# Patient Record
Sex: Female | Born: 1980 | Race: Black or African American | Hispanic: No | Marital: Married | Smoking: Never smoker
Health system: Southern US, Community
[De-identification: ages and names within clinical notes are randomized; demographics above are authoritative.]

## PROBLEM LIST (undated history)

## (undated) DIAGNOSIS — O139 Gestational [pregnancy-induced] hypertension without significant proteinuria, unspecified trimester: Secondary | ICD-10-CM

## (undated) DIAGNOSIS — O24419 Gestational diabetes mellitus in pregnancy, unspecified control: Secondary | ICD-10-CM

## (undated) DIAGNOSIS — D573 Sickle-cell trait: Secondary | ICD-10-CM

## (undated) DIAGNOSIS — D649 Anemia, unspecified: Secondary | ICD-10-CM

## (undated) DIAGNOSIS — M419 Scoliosis, unspecified: Secondary | ICD-10-CM

---
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
## 2007-03-22 ENCOUNTER — Emergency Department (HOSPITAL_COMMUNITY): Admission: EM | Admit: 2007-03-22 | Discharge: 2007-03-22 | Payer: Self-pay | Admitting: Emergency Medicine

## 2008-05-26 ENCOUNTER — Emergency Department (HOSPITAL_COMMUNITY): Admission: EM | Admit: 2008-05-26 | Discharge: 2008-05-26 | Payer: Self-pay | Admitting: Emergency Medicine

## 2010-02-04 ENCOUNTER — Inpatient Hospital Stay (HOSPITAL_COMMUNITY): Admission: AD | Admit: 2010-02-04 | Discharge: 2010-02-08 | Payer: Self-pay | Admitting: Obstetrics and Gynecology

## 2010-12-15 LAB — RPR: RPR Ser Ql: NONREACTIVE

## 2010-12-15 LAB — CBC
HCT: 34.3 % — ABNORMAL LOW (ref 36.0–46.0)
Hemoglobin: 11.5 g/dL — ABNORMAL LOW (ref 12.0–15.0)
MCHC: 34.8 g/dL (ref 30.0–36.0)
MCHC: 35 g/dL (ref 30.0–36.0)
MCV: 101.5 fL — ABNORMAL HIGH (ref 78.0–100.0)
Platelets: 161 10*3/uL (ref 150–400)
Platelets: 198 10*3/uL (ref 150–400)
RDW: 13.7 % (ref 11.5–15.5)
RDW: 14.1 % (ref 11.5–15.5)

## 2012-07-06 ENCOUNTER — Emergency Department (HOSPITAL_COMMUNITY)
Admission: EM | Admit: 2012-07-06 | Discharge: 2012-07-06 | Disposition: A | Discharge status: Home / Self Care | Payer: BC Managed Care – PPO | Source: Home / Self Care

## 2012-07-06 ENCOUNTER — Encounter (HOSPITAL_COMMUNITY): Payer: Self-pay | Admitting: *Deleted

## 2012-07-06 DIAGNOSIS — M25561 Pain in right knee: Secondary | ICD-10-CM

## 2012-07-06 DIAGNOSIS — M25569 Pain in unspecified knee: Secondary | ICD-10-CM

## 2012-07-06 MED ORDER — MELOXICAM 7.5 MG PO TABS: 7.5000 mg | ORAL_TABLET | Freq: Every day | ORAL | Status: DC

## 2012-07-06 MED ORDER — TRAMADOL HCL 50 MG PO TABS: 50.0000 mg | ORAL_TABLET | Freq: Four times a day (QID) | ORAL | Status: DC | PRN

## 2012-07-06 NOTE — ED Provider Notes (Signed)
History     CSN: [id number]  Arrival date & time 07/06/12  1654   None     Chief Complaint  Patient presents with  . Knee Pain    (Consider location/radiation/quality/duration/timing/severity/associated sxs/prior treatment) The history is provided by the patient.  complains of bilateral knee pain, right greater than left.  States pain has been recurrent over the past month.  Pain in right knee intermittent achy in nature, stiffness noted in the morning, worse with movement.  Denies known injury or past known injury.  No prior problems with this area in the past.  Has not taken medication at home for pain.  History reviewed. No pertinent past medical history.  History reviewed. No pertinent past surgical history.  Family History  Problem Relation Age of Onset  . Family history unknown: Yes    History  Substance Use Topics  . Smoking status: Never Smoker   . Smokeless tobacco: Not on file  . Alcohol Use: No    OB History    Grav Para Term Preterm Abortions TAB SAB Ect Mult Living   1 1        1       Review of Systems  Constitutional: Negative.   Respiratory: Negative.   Cardiovascular: Negative.   Musculoskeletal: Positive for arthralgias. Negative for myalgias, back pain, joint swelling and gait problem.  Skin: Negative.   Neurological: Negative.     Allergies  Review of patient's allergies indicates no known allergies.  Home Medications   Current Outpatient Rx  Name Route Sig Dispense Refill  . MELOXICAM 7.5 MG PO TABS Oral Take 1 tablet (7.5 mg total) by mouth daily. 60 tablet 1  . TRAMADOL HCL 50 MG PO TABS Oral Take 1 tablet (50 mg total) by mouth every 6 (six) hours as needed for pain. 20 tablet 0    BP 103/73  Pulse 91  Temp 98.4 F (36.9 C) (Oral)  Resp 16  SpO2 99%  LMP 05/31/2012  Physical Exam  Nursing note and vitals reviewed. Constitutional: She is oriented to person, place, and time. Vital signs are normal. She appears well-developed  and well-nourished. She is active and cooperative.  HENT:  Head: Normocephalic.  Eyes: Conjunctivae normal are normal. Pupils are equal, round, and reactive to light. No scleral icterus.  Neck: Trachea normal. Neck supple.  Cardiovascular: Normal rate and regular rhythm.   Pulmonary/Chest: Effort normal and breath sounds normal.  Musculoskeletal: Normal range of motion.       Right knee: Normal.       Left knee: Normal.  Neurological: She is alert and oriented to person, place, and time. No cranial nerve deficit or sensory deficit.  Skin: Skin is warm and dry.  Psychiatric: She has a normal mood and affect. Her speech is normal and behavior is normal. Judgment and thought content normal. Cognition and memory are normal.    ED Course  Procedures (including critical care time)  Labs Reviewed - No data to display No results found.   1. Right knee pain       MDM  Knee brace, nsaids       Johnsie Kindred, NP 07/13/12 [phone]

## 2012-07-06 NOTE — ED Notes (Signed)
Pt reports having right knee pain for the past 2 months - recently started new job requiring her to get up and down frequently - no other known injury. States that it feels like pins and needles.

## 2012-07-14 NOTE — ED Provider Notes (Signed)
Medical screening examination/treatment/procedure(s) were performed by resident physician or non-physician practitioner and as supervising physician I was immediately available for consultation/collaboration.   Bernard Slayden DOUGLAS MD.    Eryc Bodey D Layann Bluett, MD 07/14/12 1406 

## 2014-07-16 LAB — OB RESULTS CONSOLE ABO/RH: RH Type: POSITIVE

## 2014-07-16 LAB — OB RESULTS CONSOLE HEPATITIS B SURFACE ANTIGEN: HEP B S AG: NEGATIVE

## 2014-07-16 LAB — OB RESULTS CONSOLE ANTIBODY SCREEN: Antibody Screen: NEGATIVE

## 2014-07-16 LAB — OB RESULTS CONSOLE RUBELLA ANTIBODY, IGM: RUBELLA: IMMUNE

## 2014-07-16 LAB — OB RESULTS CONSOLE HIV ANTIBODY (ROUTINE TESTING): HIV: NONREACTIVE

## 2014-07-17 LAB — OB RESULTS CONSOLE GC/CHLAMYDIA
CHLAMYDIA, DNA PROBE: NEGATIVE
Gonorrhea: NEGATIVE

## 2014-07-29 ENCOUNTER — Encounter (HOSPITAL_COMMUNITY): Payer: Self-pay | Admitting: *Deleted

## 2014-11-19 LAB — OB RESULTS CONSOLE RPR: RPR: NONREACTIVE

## 2015-01-15 LAB — OB RESULTS CONSOLE GBS: STREP GROUP B AG: NEGATIVE

## 2015-02-06 ENCOUNTER — Inpatient Hospital Stay (HOSPITAL_COMMUNITY): Payer: BC Managed Care – PPO | Admitting: Anesthesiology

## 2015-02-06 ENCOUNTER — Encounter (HOSPITAL_COMMUNITY): Payer: Self-pay | Admitting: Obstetrics

## 2015-02-06 ENCOUNTER — Inpatient Hospital Stay (HOSPITAL_COMMUNITY)
Admission: AD | Admit: 2015-02-06 | Discharge: 2015-02-10 | DRG: 765 | Disposition: A | Discharge status: Home / Self Care | Payer: BC Managed Care – PPO | Source: Ambulatory Visit | Attending: Obstetrics and Gynecology | Admitting: Obstetrics and Gynecology

## 2015-02-06 DIAGNOSIS — O9989 Other specified diseases and conditions complicating pregnancy, childbirth and the puerperium: Secondary | ICD-10-CM | POA: Diagnosis present

## 2015-02-06 DIAGNOSIS — D573 Sickle-cell trait: Secondary | ICD-10-CM | POA: Diagnosis present

## 2015-02-06 DIAGNOSIS — Z3A39 39 weeks gestation of pregnancy: Secondary | ICD-10-CM | POA: Diagnosis present

## 2015-02-06 DIAGNOSIS — R030 Elevated blood-pressure reading, without diagnosis of hypertension: Secondary | ICD-10-CM | POA: Diagnosis not present

## 2015-02-06 DIAGNOSIS — O403XX0 Polyhydramnios, third trimester, not applicable or unspecified: Secondary | ICD-10-CM | POA: Diagnosis present

## 2015-02-06 DIAGNOSIS — O9903 Anemia complicating the puerperium: Secondary | ICD-10-CM | POA: Diagnosis present

## 2015-02-06 DIAGNOSIS — N838 Other noninflammatory disorders of ovary, fallopian tube and broad ligament: Secondary | ICD-10-CM | POA: Diagnosis present

## 2015-02-06 DIAGNOSIS — D62 Acute posthemorrhagic anemia: Secondary | ICD-10-CM | POA: Diagnosis not present

## 2015-02-06 DIAGNOSIS — O3663X0 Maternal care for excessive fetal growth, third trimester, not applicable or unspecified: Secondary | ICD-10-CM | POA: Diagnosis present

## 2015-02-06 HISTORY — DX: Scoliosis, unspecified: M41.9

## 2015-02-06 HISTORY — DX: Sickle-cell trait: D57.3

## 2015-02-06 LAB — URIC ACID: Uric Acid, Serum: 4.9 mg/dL (ref 2.3–6.6)

## 2015-02-06 LAB — CBC
HCT: 33.9 % — ABNORMAL LOW (ref 36.0–46.0)
Hemoglobin: 11.9 g/dL — ABNORMAL LOW (ref 12.0–15.0)
MCH: 33.6 pg (ref 26.0–34.0)
MCHC: 35.1 g/dL (ref 30.0–36.0)
MCV: 95.8 fL (ref 78.0–100.0)
Platelets: 224 10*3/uL (ref 150–400)
RBC: 3.54 MIL/uL — ABNORMAL LOW (ref 3.87–5.11)
RDW: 14.2 % (ref 11.5–15.5)
WBC: 6.2 10*3/uL (ref 4.0–10.5)

## 2015-02-06 LAB — COMPREHENSIVE METABOLIC PANEL
ALT: 10 U/L — ABNORMAL LOW (ref 14–54)
AST: 28 U/L (ref 15–41)
Albumin: 3.2 g/dL — ABNORMAL LOW (ref 3.5–5.0)
Alkaline Phosphatase: 98 U/L (ref 38–126)
Anion gap: 6 (ref 5–15)
BUN: 5 mg/dL — ABNORMAL LOW (ref 6–20)
CO2: 23 mmol/L (ref 22–32)
Calcium: 8.8 mg/dL — ABNORMAL LOW (ref 8.9–10.3)
Chloride: 108 mmol/L (ref 101–111)
Creatinine, Ser: 0.45 mg/dL (ref 0.44–1.00)
GFR calc Af Amer: >60 mL/min (ref >60)
GFR calc non Af Amer: >60 mL/min (ref >60)
Glucose, Bld: 109 mg/dL — ABNORMAL HIGH (ref 65–99)
Potassium: 3.9 mmol/L (ref 3.5–5.1)
Sodium: 137 mmol/L (ref 135–145)
Total Bilirubin: 0.9 mg/dL (ref 0.3–1.2)
Total Protein: 6.2 g/dL — ABNORMAL LOW (ref 6.5–8.1)

## 2015-02-06 LAB — TYPE AND SCREEN
ABO/RH(D): O POS
ANTIBODY SCREEN: NEGATIVE

## 2015-02-06 LAB — ABO/RH: ABO/RH(D): O POS

## 2015-02-06 MED ORDER — EPHEDRINE 5 MG/ML INJ: 10.0000 mg | INTRAVENOUS | Status: DC | PRN

## 2015-02-06 MED ORDER — CITRIC ACID-SODIUM CITRATE 334-500 MG/5ML PO SOLN
30.0000 mL | ORAL | Status: DC | PRN
  Administered 2015-02-07: 30 mL via ORAL
  Filled 2015-02-06: qty 15

## 2015-02-06 MED ORDER — ACETAMINOPHEN 325 MG PO TABS: 650.0000 mg | ORAL_TABLET | ORAL | Status: DC | PRN

## 2015-02-06 MED ORDER — FENTANYL 2.5 MCG/ML BUPIVACAINE 1/10 % EPIDURAL INFUSION (WH - ANES): 14.0000 mL/h | INTRAMUSCULAR | Status: DC | PRN

## 2015-02-06 MED ORDER — OXYTOCIN 40 UNITS IN LACTATED RINGERS INFUSION - SIMPLE MED
1.0000 m[IU]/min | INTRAVENOUS | Status: DC
  Administered 2015-02-06: 2 m[IU]/min via INTRAVENOUS
  Administered 2015-02-07: 4 m[IU]/min via INTRAVENOUS
  Administered 2015-02-07: 2 m[IU]/min via INTRAVENOUS
  Filled 2015-02-06: qty 1000

## 2015-02-06 MED ORDER — FENTANYL 2.5 MCG/ML BUPIVACAINE 1/10 % EPIDURAL INFUSION (WH - ANES)
14.0000 mL/h | INTRAMUSCULAR | Status: DC | PRN
  Administered 2015-02-06 – 2015-02-07 (×2): 14 mL/h via EPIDURAL
  Filled 2015-02-06 (×3): qty 125

## 2015-02-06 MED ORDER — OXYCODONE-ACETAMINOPHEN 5-325 MG PO TABS: 1.0000 | ORAL_TABLET | ORAL | Status: DC | PRN

## 2015-02-06 MED ORDER — LACTATED RINGERS IV SOLN
500.0000 mL | INTRAVENOUS | Status: DC | PRN
  Administered 2015-02-06: 1000 mL via INTRAVENOUS
  Administered 2015-02-06: 300 mL via INTRAVENOUS

## 2015-02-06 MED ORDER — PHENYLEPHRINE 40 MCG/ML (10ML) SYRINGE FOR IV PUSH (FOR BLOOD PRESSURE SUPPORT)
80.0000 ug | PREFILLED_SYRINGE | INTRAVENOUS | Status: DC | PRN
  Filled 2015-02-06 (×2): qty 20

## 2015-02-06 MED ORDER — LIDOCAINE HCL (PF) 1 % IJ SOLN: 30.0000 mL | INTRAMUSCULAR | Status: DC | PRN

## 2015-02-06 MED ORDER — OXYCODONE-ACETAMINOPHEN 5-325 MG PO TABS
2.0000 | ORAL_TABLET | ORAL | Status: DC | PRN
Start: 2015-02-06 — End: 2015-02-07

## 2015-02-06 MED ORDER — BUTORPHANOL TARTRATE 1 MG/ML IJ SOLN
1.0000 mg | INTRAMUSCULAR | Status: DC | PRN
  Administered 2015-02-06: 1 mg via INTRAVENOUS
  Filled 2015-02-06: qty 1

## 2015-02-06 MED ORDER — TERBUTALINE SULFATE 1 MG/ML IJ SOLN
INTRAMUSCULAR | Status: AC
  Administered 2015-02-06: 0.25 mg via SUBCUTANEOUS
  Filled 2015-02-06: qty 1

## 2015-02-06 MED ORDER — OXYTOCIN BOLUS FROM INFUSION: 500.0000 mL | INTRAVENOUS | Status: DC

## 2015-02-06 MED ORDER — OXYTOCIN 40 UNITS IN LACTATED RINGERS INFUSION - SIMPLE MED: 62.5000 mL/h | INTRAVENOUS | Status: DC

## 2015-02-06 MED ORDER — LACTATED RINGERS IV SOLN
INTRAVENOUS | Status: DC
  Administered 2015-02-06 – 2015-02-07 (×3): via INTRAVENOUS

## 2015-02-06 MED ORDER — DIPHENHYDRAMINE HCL 50 MG/ML IJ SOLN: 12.5000 mg | INTRAMUSCULAR | Status: DC | PRN

## 2015-02-06 MED ORDER — LIDOCAINE HCL (PF) 1 % IJ SOLN
INTRAMUSCULAR | Status: DC | PRN
  Administered 2015-02-06: 4 mL
  Administered 2015-02-06: 3 mL
  Administered 2015-02-06: 4 mL

## 2015-02-06 NOTE — H&P (Signed)
  OB ADMISSION/ HISTORY & PHYSICAL:  Admission Date: 02/06/2015 12:40 PM  Admit Diagnosis: 39.4 weeks polyhydramnios  Ellen Rowe is a 34 y.o. female presenting for induction of labor  Prenatal History: G3P1   EDC : 02/09/2015, by Other Basis  [facility]  Primary Ob Provider: Graylen Noboa [facility]  Prenatal Labs: ABO, Rh: O/Positive/-- (10/20 0000) Antibody: Negative (10/20 0000) Rubella: Immune (10/20 0000)  RPR: Nonreactive (02/23 0000)  HBsAg: Negative (10/20 0000)  HIV: Non-reactive (10/20 0000)  GTT: 132 GBS: Negative (04/20 0000)   Medical / Surgical History :  Past medical history:  Past Medical History  Diagnosis Date  . Sickle cell trait   . Scoliosis     Past surgical history:  Past Surgical History  Procedure Laterality Date  . Wisdom tooth extraction  2000   Family History:  Family History  Problem Relation Age of Onset  . Family history unknown: Yes    Social History:  reports that she has never smoked. She has never used smokeless tobacco. She reports that she does not drink alcohol or use illicit drugs.  Allergies: Review of patient's allergies indicates no known allergies.   Current Medications at time of admission:  Prior to Admission medications   Medication Sig Start Date End Date Taking? Authorizing Provider  Prenatal Vit-Fe Fumarate-FA (MULTIVITAMIN-PRENATAL) 27-0.8 MG TABS tablet Take 1 tablet by mouth daily at 12 noon.   Yes Historical Provider, MD   Review of Systems: Active FM No ctx felt No LOF bloody show absent  Physical Exam:  VS: Blood pressure 145/85, pulse 120, temperature 98.8 F (37.1 C), temperature source Oral, resp. rate 20, height 5\' 1"  (1.549 m), weight 102.967 kg (227 lb).  General: alert and oriented, appears comfortable Heart: RRR Lungs: Clear lung fields Abdomen: Gravid, soft and non-tender, non-distended / uterus: gravid with  size>dates Extremities: 3+ dependent edema  Genitalia / VE:  posterior / soft / FT-1cm / 50% / vtx -3 ballotable  FHR: baseline rate 150 / variability decreased  / accelerations absent / no decelerations TOCO: no ctx or UI  Assessment: 39.[redacted] weeks gestation with size>dates polyhydramnios Induction of labor FHR category 2   Plan:  Admit Cervical balloon with low dose pitocin   Dr Cherly Hensenousins notified of admission / plan of care - MD to follow patient   Marlinda MikeBAILEY, TANYA CNM, MSN, [facility]AC[facility] 02/06/2015, 2:21 PM

## 2015-02-06 NOTE — Progress Notes (Signed)
Ellen Rowe is a 34 y.o. G3P1 at 9669w4d by ultrasound admitted for induction of labor due to Hydramnios.  Subjective: S/p stadol. (+) painful ctx  Objective: BP 139/90 mmHg  Pulse 104  Temp(Src) 98.5 F (36.9 C) (Oral)  Resp 20  Ht 5\' 1"  (1.549 m)  Wt 102.967 kg (227 lb)  BMI 42.91 kg/m2    Pitocin  FHT:  FHR: 150 bpm, variability: minimal ,  accelerations:  Abscent,  decelerations:  Absent UC:   irregular, every 2-4 minutes SVE:   Balloon removed. Cervix ? 3 cm/patulous/ pp out of pelvis   Labs: Lab Results  Component Value Date   WBC 6.2 02/06/2015   HGB 11.9* 02/06/2015   HCT 33.9* 02/06/2015   MCV 95.8 02/06/2015   PLT 224 02/06/2015    Assessment / Plan: Polyhydramnios Term gestation P) Epidural. Cont pitocin. Controlled amniotomy prn  Anticipated MOD:  NSVD  Ellen Rowe A 02/06/2015, 8:39 PM

## 2015-02-06 NOTE — Anesthesia Procedure Notes (Signed)
Epidural Patient location during procedure: OB Start time: 02/06/2015 9:30 PM  Preanesthetic Checklist Completed: patient identified, site marked, surgical consent, pre-op evaluation, timeout performed, IV checked, risks and benefits discussed and monitors and equipment checked  Epidural Patient position: sitting Prep: site prepped and draped and DuraPrep Patient monitoring: continuous pulse ox and blood pressure Approach: midline Location: L3-L4 Injection technique: LOR air  Needle:  Needle type: Tuohy  Needle gauge: 17 G Needle length: 9 cm and 9 Needle insertion depth: 6 cm Catheter type: closed end flexible Catheter size: 19 Gauge Catheter at skin depth: 12 cm Test dose: negative  Assessment Events: blood not aspirated, injection not painful, no injection resistance, negative IV test and no paresthesia  Additional Notes After 40 mg Xylo thru needle( 21:37 ), I tried to pull the catheter back to 12 cm. The was so much compressive force that it would not move. In laying her down on her R side and pulling the catheter slowly came out, but not without stretching and thinning. I did not feel comfortable leaving this catheter in place; it looked like it would fail to inject easily for a STAT CS or the pump infusion.  I removed it intact and visibly stretched out.  I reprepped and made my insertion site a cm below the other. This time the angulation favored easy catheter depth adjustment , and worked well. (- ) SAB test dose.( 21:47) Dosed slowly thru catheter with 3-774min between doses. Explained to pt.

## 2015-02-06 NOTE — Anesthesia Preprocedure Evaluation (Signed)

## 2015-02-06 NOTE — Progress Notes (Signed)
S:  Understands plan of care for IOL  O:  VS: Blood pressure 145/85, pulse 120, temperature 98.8 F (37.1 C), temperature source Oral, resp. rate 20, height 5\' 1"  (1.549 m), weight 102.967 kg (227 lb).        FHR : baseline 150 / variability minimal - moderate / accelerations 10x10 / no decelerations        Toco: no ctx         Cervical balloon placed without difficulty - inflated with 60 ml - tolerated well by patient  A: induction labor  P: cervical balloon to traction - increase traction Q hour       low dose pitocin prime to 4310mu/min until balloon out then titrate as needed   Susa LofflerBAILEY, Jacquiline Zurcher CNM, MSN, [facility]FACNM 02/06/2015, 2:20 PM

## 2015-02-07 ENCOUNTER — Encounter (HOSPITAL_COMMUNITY): Payer: Self-pay | Admitting: Anesthesiology

## 2015-02-07 ENCOUNTER — Encounter (HOSPITAL_COMMUNITY)
Admission: AD | Disposition: A | Discharge status: Home / Self Care | Payer: Self-pay | Source: Ambulatory Visit | Attending: Obstetrics and Gynecology

## 2015-02-07 LAB — RPR: RPR Ser Ql: NONREACTIVE

## 2015-02-07 SURGERY — Surgical Case
Anesthesia: Epidural | Site: Abdomen

## 2015-02-07 MED ORDER — SODIUM BICARBONATE 8.4 % IV SOLN
INTRAVENOUS | Status: AC
  Filled 2015-02-07: qty 50

## 2015-02-07 MED ORDER — MORPHINE SULFATE 0.5 MG/ML IJ SOLN
INTRAMUSCULAR | Status: AC
  Filled 2015-02-07: qty 10

## 2015-02-07 MED ORDER — SODIUM CHLORIDE 0.9 % IV SOLN
250.0000 mL | INTRAVENOUS | Status: DC
Start: 2015-02-07 — End: 2015-02-09

## 2015-02-07 MED ORDER — ACETAMINOPHEN 500 MG PO TABS
1000.0000 mg | ORAL_TABLET | Freq: Four times a day (QID) | ORAL | Status: AC
  Administered 2015-02-07 – 2015-02-08 (×3): 1000 mg via ORAL
  Filled 2015-02-07 (×2): qty 2

## 2015-02-07 MED ORDER — ACETAMINOPHEN 500 MG PO TABS
ORAL_TABLET | ORAL | Status: AC
  Filled 2015-02-07: qty 2

## 2015-02-07 MED ORDER — ACETAMINOPHEN 325 MG PO TABS: 650.0000 mg | ORAL_TABLET | ORAL | Status: DC | PRN

## 2015-02-07 MED ORDER — LANOLIN HYDROUS EX OINT: 1.0000 "application " | TOPICAL_OINTMENT | CUTANEOUS | Status: DC | PRN

## 2015-02-07 MED ORDER — SODIUM CHLORIDE 0.9 % IJ SOLN: 3.0000 mL | INTRAMUSCULAR | Status: DC | PRN

## 2015-02-07 MED ORDER — CEFAZOLIN SODIUM-DEXTROSE 2-3 GM-% IV SOLR
INTRAVENOUS | Status: DC | PRN
  Administered 2015-02-07: 2 g via INTRAVENOUS

## 2015-02-07 MED ORDER — FLEET ENEMA 7-19 GM/118ML RE ENEM: 1.0000 | ENEMA | Freq: Every day | RECTAL | Status: DC | PRN

## 2015-02-07 MED ORDER — DIPHENHYDRAMINE HCL 50 MG/ML IJ SOLN: 12.5000 mg | INTRAMUSCULAR | Status: DC | PRN

## 2015-02-07 MED ORDER — DIBUCAINE 1 % RE OINT: 1.0000 "application " | TOPICAL_OINTMENT | RECTAL | Status: DC | PRN

## 2015-02-07 MED ORDER — BUPIVACAINE HCL (PF) 0.25 % IJ SOLN
INTRAMUSCULAR | Status: AC
  Filled 2015-02-07: qty 30

## 2015-02-07 MED ORDER — KETOROLAC TROMETHAMINE 30 MG/ML IJ SOLN
INTRAMUSCULAR | Status: AC
  Filled 2015-02-07: qty 1

## 2015-02-07 MED ORDER — ONDANSETRON HCL 4 MG/2ML IJ SOLN
INTRAMUSCULAR | Status: DC | PRN
  Administered 2015-02-07: 4 mg via INTRAVENOUS

## 2015-02-07 MED ORDER — DIPHENHYDRAMINE HCL 25 MG PO CAPS: 25.0000 mg | ORAL_CAPSULE | ORAL | Status: DC | PRN

## 2015-02-07 MED ORDER — WITCH HAZEL-GLYCERIN EX PADS: 1.0000 "application " | MEDICATED_PAD | CUTANEOUS | Status: DC | PRN

## 2015-02-07 MED ORDER — BISACODYL 10 MG RE SUPP: 10.0000 mg | Freq: Every day | RECTAL | Status: DC | PRN

## 2015-02-07 MED ORDER — LIDOCAINE-EPINEPHRINE (PF) 2 %-1:200000 IJ SOLN
INTRAMUSCULAR | Status: AC
Start: 2015-02-07 — End: 2015-02-07
  Filled 2015-02-07: qty 20

## 2015-02-07 MED ORDER — BUPIVACAINE HCL (PF) 0.25 % IJ SOLN
INTRAMUSCULAR | Status: DC | PRN
  Administered 2015-02-07: 10 mL

## 2015-02-07 MED ORDER — LACTATED RINGERS IV SOLN: INTRAVENOUS | Status: DC

## 2015-02-07 MED ORDER — MENTHOL 3 MG MT LOZG: 1.0000 | LOZENGE | OROMUCOSAL | Status: DC | PRN

## 2015-02-07 MED ORDER — SCOPOLAMINE 1 MG/3DAYS TD PT72
MEDICATED_PATCH | TRANSDERMAL | Status: DC | PRN
  Administered 2015-02-07: 1 via TRANSDERMAL

## 2015-02-07 MED ORDER — KETOROLAC TROMETHAMINE 30 MG/ML IJ SOLN
30.0000 mg | Freq: Once | INTRAMUSCULAR | Status: AC
  Administered 2015-02-07: 30 mg via INTRAMUSCULAR

## 2015-02-07 MED ORDER — METHYLERGONOVINE MALEATE 0.2 MG/ML IJ SOLN: 0.2000 mg | INTRAMUSCULAR | Status: DC | PRN

## 2015-02-07 MED ORDER — LACTATED RINGERS IV SOLN
INTRAVENOUS | Status: DC | PRN
  Administered 2015-02-07: 07:00:00 via INTRAVENOUS

## 2015-02-07 MED ORDER — PHENYLEPHRINE 40 MCG/ML (10ML) SYRINGE FOR IV PUSH (FOR BLOOD PRESSURE SUPPORT)
PREFILLED_SYRINGE | INTRAVENOUS | Status: AC
  Filled 2015-02-07: qty 10

## 2015-02-07 MED ORDER — SENNOSIDES-DOCUSATE SODIUM 8.6-50 MG PO TABS
2.0000 | ORAL_TABLET | ORAL | Status: DC
  Administered 2015-02-07 – 2015-02-09 (×3): 2 via ORAL
  Filled 2015-02-07 (×3): qty 2

## 2015-02-07 MED ORDER — MEPERIDINE HCL 25 MG/ML IJ SOLN: 6.2500 mg | INTRAMUSCULAR | Status: DC | PRN

## 2015-02-07 MED ORDER — MORPHINE SULFATE (PF) 0.5 MG/ML IJ SOLN
INTRAMUSCULAR | Status: DC | PRN
  Administered 2015-02-07: 1 mg via EPIDURAL
  Administered 2015-02-07: 2 mg via EPIDURAL

## 2015-02-07 MED ORDER — SCOPOLAMINE 1 MG/3DAYS TD PT72
1.0000 | MEDICATED_PATCH | Freq: Once | TRANSDERMAL | Status: DC
  Filled 2015-02-07: qty 1

## 2015-02-07 MED ORDER — OXYCODONE-ACETAMINOPHEN 5-325 MG PO TABS
1.0000 | ORAL_TABLET | ORAL | Status: DC | PRN
  Administered 2015-02-08 – 2015-02-10 (×8): 1 via ORAL
  Filled 2015-02-07 (×8): qty 1

## 2015-02-07 MED ORDER — SODIUM CHLORIDE 0.9 % IJ SOLN: 3.0000 mL | Freq: Two times a day (BID) | INTRAMUSCULAR | Status: DC

## 2015-02-07 MED ORDER — PRENATAL MULTIVITAMIN CH
1.0000 | ORAL_TABLET | Freq: Every day | ORAL | Status: DC
  Administered 2015-02-08 – 2015-02-10 (×3): 1 via ORAL
  Filled 2015-02-07 (×3): qty 1

## 2015-02-07 MED ORDER — ONDANSETRON HCL 4 MG/2ML IJ SOLN
INTRAMUSCULAR | Status: AC
  Filled 2015-02-07: qty 2

## 2015-02-07 MED ORDER — NALBUPHINE HCL 10 MG/ML IJ SOLN
5.0000 mg | INTRAMUSCULAR | Status: DC | PRN
  Administered 2015-02-07 (×2): 5 mg via INTRAVENOUS
  Filled 2015-02-07 (×2): qty 1

## 2015-02-07 MED ORDER — CEFAZOLIN SODIUM-DEXTROSE 2-3 GM-% IV SOLR
INTRAVENOUS | Status: AC
  Filled 2015-02-07: qty 50

## 2015-02-07 MED ORDER — METHYLERGONOVINE MALEATE 0.2 MG PO TABS: 0.2000 mg | ORAL_TABLET | ORAL | Status: DC | PRN

## 2015-02-07 MED ORDER — SODIUM BICARBONATE 8.4 % IV SOLN
INTRAVENOUS | Status: DC | PRN
  Administered 2015-02-07 (×4): 5 mL via EPIDURAL

## 2015-02-07 MED ORDER — MEPERIDINE HCL 25 MG/ML IJ SOLN
INTRAMUSCULAR | Status: AC
  Filled 2015-02-07: qty 1

## 2015-02-07 MED ORDER — SODIUM CHLORIDE 0.9 % IJ SOLN
3.0000 mL | INTRAMUSCULAR | Status: DC | PRN
Start: 2015-02-07 — End: 2015-02-09

## 2015-02-07 MED ORDER — FERROUS SULFATE 325 (65 FE) MG PO TABS
325.0000 mg | ORAL_TABLET | Freq: Two times a day (BID) | ORAL | Status: DC
  Administered 2015-02-07 – 2015-02-08 (×2): 325 mg via ORAL
  Filled 2015-02-07 (×2): qty 1

## 2015-02-07 MED ORDER — SODIUM CHLORIDE 0.9 % IR SOLN
Status: DC | PRN
  Administered 2015-02-07: 1000 mL

## 2015-02-07 MED ORDER — SIMETHICONE 80 MG PO CHEW
80.0000 mg | CHEWABLE_TABLET | ORAL | Status: DC
  Administered 2015-02-07: 80 mg via ORAL
  Filled 2015-02-07 (×3): qty 1

## 2015-02-07 MED ORDER — NALOXONE HCL 1 MG/ML IJ SOLN
1.0000 ug/kg/h | INTRAVENOUS | Status: DC | PRN
  Filled 2015-02-07: qty 2

## 2015-02-07 MED ORDER — LACTATED RINGERS IV SOLN
40.0000 [IU] | INTRAVENOUS | Status: DC | PRN
  Administered 2015-02-07: 40 [IU] via INTRAVENOUS

## 2015-02-07 MED ORDER — ZOLPIDEM TARTRATE 5 MG PO TABS: 5.0000 mg | ORAL_TABLET | Freq: Every evening | ORAL | Status: DC | PRN

## 2015-02-07 MED ORDER — NALBUPHINE HCL 10 MG/ML IJ SOLN: 5.0000 mg | Freq: Once | INTRAMUSCULAR | Status: AC | PRN

## 2015-02-07 MED ORDER — MEPERIDINE HCL 25 MG/ML IJ SOLN
INTRAMUSCULAR | Status: DC | PRN
  Administered 2015-02-07 (×2): 12.5 mg via INTRAVENOUS

## 2015-02-07 MED ORDER — HYDROMORPHONE HCL 1 MG/ML IJ SOLN: 0.2500 mg | INTRAMUSCULAR | Status: DC | PRN

## 2015-02-07 MED ORDER — SIMETHICONE 80 MG PO CHEW: 80.0000 mg | CHEWABLE_TABLET | ORAL | Status: DC | PRN

## 2015-02-07 MED ORDER — IBUPROFEN 600 MG PO TABS
600.0000 mg | ORAL_TABLET | Freq: Four times a day (QID) | ORAL | Status: DC
  Administered 2015-02-07 – 2015-02-10 (×12): 600 mg via ORAL
  Filled 2015-02-07 (×12): qty 1

## 2015-02-07 MED ORDER — OXYTOCIN 40 UNITS IN LACTATED RINGERS INFUSION - SIMPLE MED: 62.5000 mL/h | INTRAVENOUS | Status: AC

## 2015-02-07 MED ORDER — NALOXONE HCL 0.4 MG/ML IJ SOLN: 0.4000 mg | INTRAMUSCULAR | Status: DC | PRN

## 2015-02-07 MED ORDER — SIMETHICONE 80 MG PO CHEW
80.0000 mg | CHEWABLE_TABLET | Freq: Three times a day (TID) | ORAL | Status: DC
  Administered 2015-02-07 – 2015-02-10 (×8): 80 mg via ORAL
  Filled 2015-02-07 (×7): qty 1

## 2015-02-07 MED ORDER — OXYCODONE-ACETAMINOPHEN 5-325 MG PO TABS: 2.0000 | ORAL_TABLET | ORAL | Status: DC | PRN

## 2015-02-07 MED ORDER — NALBUPHINE HCL 10 MG/ML IJ SOLN: 5.0000 mg | INTRAMUSCULAR | Status: DC | PRN

## 2015-02-07 MED ORDER — ONDANSETRON HCL 4 MG/2ML IJ SOLN: 4.0000 mg | Freq: Three times a day (TID) | INTRAMUSCULAR | Status: DC | PRN

## 2015-02-07 MED ORDER — DIPHENHYDRAMINE HCL 25 MG PO CAPS: 25.0000 mg | ORAL_CAPSULE | Freq: Four times a day (QID) | ORAL | Status: DC | PRN

## 2015-02-07 MED ORDER — OXYTOCIN 10 UNIT/ML IJ SOLN
INTRAMUSCULAR | Status: AC
  Filled 2015-02-07: qty 4

## 2015-02-07 SURGICAL SUPPLY — 40 items
APL SKNCLS STERI-STRIP NONHPOA (GAUZE/BANDAGES/DRESSINGS)
BARRIER ADHS 3X4 INTERCEED (GAUZE/BANDAGES/DRESSINGS) ×2 IMPLANT
BENZOIN TINCTURE PRP APPL 2/3 (GAUZE/BANDAGES/DRESSINGS) IMPLANT
BRR ADH 4X3 ABS CNTRL BYND (GAUZE/BANDAGES/DRESSINGS) ×1
CLAMP CORD UMBIL (MISCELLANEOUS) IMPLANT
CLOTH BEACON ORANGE TIMEOUT ST (SAFETY) ×2 IMPLANT
CONTAINER PREFILL 10% NBF 15ML (MISCELLANEOUS) IMPLANT
DRAPE SHEET LG 3/4 BI-LAMINATE (DRAPES) IMPLANT
DRSG OPSITE POSTOP 4X10 (GAUZE/BANDAGES/DRESSINGS) ×2 IMPLANT
DURAPREP 26ML APPLICATOR (WOUND CARE) ×2 IMPLANT
ELECT REM PT RETURN 9FT ADLT (ELECTROSURGICAL) ×2
ELECTRODE REM PT RTRN 9FT ADLT (ELECTROSURGICAL) ×1 IMPLANT
EXTRACTOR VACUUM M CUP 4 TUBE (SUCTIONS) IMPLANT
GLOVE BIOGEL PI IND STRL 7.0 (GLOVE) ×2 IMPLANT
GLOVE BIOGEL PI INDICATOR 7.0 (GLOVE) ×2
GLOVE ECLIPSE 6.5 STRL STRAW (GLOVE) ×4 IMPLANT
GOWN STRL REUS W/TWL LRG LVL3 (GOWN DISPOSABLE) ×8 IMPLANT
KIT ABG SYR 3ML LUER SLIP (SYRINGE) IMPLANT
NEEDLE HYPO 22GX1.5 SAFETY (NEEDLE) ×2 IMPLANT
NEEDLE HYPO 25X5/8 SAFETYGLIDE (NEEDLE) IMPLANT
NS IRRIG 1000ML POUR BTL (IV SOLUTION) ×2 IMPLANT
PACK C SECTION WH (CUSTOM PROCEDURE TRAY) ×2 IMPLANT
PAD OB MATERNITY 4.3X12.25 (PERSONAL CARE ITEMS) ×2 IMPLANT
RTRCTR C-SECT PINK 25CM LRG (MISCELLANEOUS) IMPLANT
STAPLER VISISTAT 35W (STAPLE) IMPLANT
STRIP CLOSURE SKIN 1/2X4 (GAUZE/BANDAGES/DRESSINGS) IMPLANT
SUT CHROMIC GUT AB #0 18 (SUTURE) IMPLANT
SUT MNCRL 0 VIOLET CTX 36 (SUTURE) ×3 IMPLANT
SUT MON AB 4-0 PS1 27 (SUTURE) IMPLANT
SUT MONOCRYL 0 CTX 36 (SUTURE) ×3
SUT PLAIN 2 0 (SUTURE)
SUT PLAIN 2 0 XLH (SUTURE) ×2 IMPLANT
SUT PLAIN ABS 2-0 CT1 27XMFL (SUTURE) IMPLANT
SUT VIC AB 0 CT1 36 (SUTURE) ×4 IMPLANT
SUT VIC AB 2-0 CT1 27 (SUTURE) ×4
SUT VIC AB 2-0 CT1 TAPERPNT 27 (SUTURE) ×2 IMPLANT
SUT VIC AB 4-0 PS2 27 (SUTURE) IMPLANT
SYR CONTROL 10ML LL (SYRINGE) ×2 IMPLANT
TOWEL OR 17X24 6PK STRL BLUE (TOWEL DISPOSABLE) ×2 IMPLANT
TRAY FOLEY CATH SILVER 14FR (SET/KITS/TRAYS/PACK) IMPLANT

## 2015-02-07 NOTE — Brief Op Note (Signed)
02/06/2015 - 02/07/2015  7:36 AM  PATIENT:  Ellen Rowe  34 y.o. female  PRE-OPERATIVE DIAGNOSIS:  Arrest of dilation, polyhydramnios, term gestation  POST-OPERATIVE DIAGNOSIS:  Right ovarian mass, fetal macrosomia, polyhydramnios, term gestation, arrest of dilation  PROCEDURE:  Primary Cesarean section, kerr hysterotomy,  Excision of right ovarian mass  SURGEON:  Surgeon(s) and Role:    * IT trainerheronette Sierra Bissonette, MD - Primary  PHYSICIAN ASSISTANT:   ASSISTANTS: Marlinda Mikeanya Bailey, CNM    ANESTHESIA:   epidural    findings: live direct OP female Apgar 8/9, post placenta, nl tubes, left ov nl, right ov with distal 2.5cm adjacent mass  EBL:  Total I/O In: 1000 [I.V.:1000] Out: 275 [Urine:250; Blood:25]  BLOOD ADMINISTERED:none  DRAINS: none   LOCAL MEDICATIONS USED:  MARCAINE     SPECIMEN:  Source of Specimen:  right ovary mass  DISPOSITION OF SPECIMEN:  PATHOLOGY  COUNTS:  YES  TOURNIQUET:  * No tourniquets in log *  DICTATION: .Other Dictation: Dictation Number [id number]  PLAN OF CARE: Admit to inpatient   PATIENT DISPOSITION:  PACU - hemodynamically stable.   Delay start of Pharmacological VTE agent (>24hrs) due to surgical blood loss or risk of bleeding: no

## 2015-02-07 NOTE — Progress Notes (Signed)
C/o  Pain VE: 4/edematous/-2 Tracing: baseline 160 Ctx q 2-3 mins  IMP: arrest of dilation P) recommend C/S. Risk of C/s reviewed including infection, bleeding, injury to bladder, ureter, bowels, internal scar tissue, poss blood transfusion and its risk( HiV, acute rxn, hepatitis). Consent signed. OR notified

## 2015-02-07 NOTE — Anesthesia Postprocedure Evaluation (Signed)
  Anesthesia Post-op Note  Patient: Ellen Rowe  Procedure(s) Performed: Procedure(s): CESAREAN SECTION (N/A)  Patient Location: PACU  Anesthesia Type:Spinal  Level of Consciousness: awake, alert  and oriented  Airway and Oxygen Therapy: Patient Spontanous Breathing  Post-op Pain: none  Post-op Assessment: Post-op Vital signs reviewed, Patient's Cardiovascular Status Stable, Respiratory Function Stable, Patent Airway, No signs of Nausea or vomiting, Pain level controlled, No headache and No backache  Post-op Vital Signs: Reviewed and stable  Last Vitals:  Filed Vitals:   02/07/15 0830  BP: 135/78  Pulse: 110  Temp:   Resp: 20    Complications: No apparent anesthesia complications

## 2015-02-07 NOTE — Anesthesia Postprocedure Evaluation (Signed)
  Anesthesia Post-op Note  Patient: Ellen Rowe  Procedure(s) Performed: Procedure(s): CESAREAN SECTION (N/A)  Patient Location: PACU and Mother/Baby  Anesthesia Type:Spinal  Level of Consciousness: awake, alert , oriented and patient cooperative  Airway and Oxygen Therapy: Patient Spontanous Breathing  Post-op Pain: none  Post-op Assessment: Post-op Vital signs reviewed, No headache, No backache, No residual numbness and No residual motor weakness  Post-op Vital Signs: Reviewed and stable  Last Vitals:  Filed Vitals:   02/07/15 2010  BP: 107/71  Pulse: 18  Temp: 37.1 C  Resp: 18    Complications: No apparent anesthesia complications

## 2015-02-07 NOTE — Addendum Note (Signed)
Addendum  created 02/07/15 2157 by Rosalia HammersMargaret S Abdiel Blackerby, CRNA   Modules edited: Notes Section   Notes Section:  File: [id number]

## 2015-02-07 NOTE — Progress Notes (Signed)
S: no complaint  O: Epidural Pitocin VE: vtx ballottable cervix 3-4 cm bulging membrane Incidental rupture of membrane... Voluminous amount of clear fluid.  ISE placed which led to more fluid. Digital exam remained until loss of fluid stabilized.  No palp cord Some vaginal bleeding noted at the end after IUPC placement  Tracing: baseline 150 (+) deceleration noted w/ tetanic ctxs.  Between terb given. Scalp stim done Ctx q 1- 1 1/2 mins Maternal positional changes. Pitocin d/c 'ed. Deceleration resolved  IMP: latent phase Polyhydramnios Term gestation P) will resume pitocin when feasible. Close fetal monitoring

## 2015-02-07 NOTE — Progress Notes (Signed)
S: c/o vaginal pressure and pain  O: pitocin  Epidural VE unchanged per RN  Tracing: baseline 150 small accels Ctx q 2-3 mins with couplets  IMP: Latent/active phase  Polyhydramnios P) cont with increase pitocin. Pain mgmt

## 2015-02-07 NOTE — Transfer of Care (Signed)
Immediate Anesthesia Transfer of Care Note  Patient: Ellen Rowe  Procedure(s) Performed: Procedure(s): CESAREAN SECTION (N/A)  Patient Location: PACU  Anesthesia Type:Epidural  Level of Consciousness: awake, alert  and oriented  Airway & Oxygen Therapy: Patient Spontanous Breathing  Post-op Assessment: Report given to RN and Post -op Vital signs reviewed and stable  Post vital signs: Reviewed and stable  Last Vitals:  Filed Vitals:   02/07/15 0531  BP: 138/77  Pulse: 114  Temp:   Resp:     Complications: No apparent anesthesia complications

## 2015-02-07 NOTE — Lactation Note (Addendum)
This note was copied from the chart of Ellen Janessa Tsou. Lactation Consultation Note  P2, BF older son for 5 months. Mother concerned baby is not getting enough. She has hand expressed drops of colostrum. Discussed size of baby's stomach, cluster feeding and supply and demand. Mother states this baby is latching well and feeds for 15-20 min.   Mother called for assistance w/ latching after bath.  Baby sucking on tongue sleepy. Attempted latching in cross cradle and football but baby not interested. Placed baby STS on mother's chest and suggest she retry when baby starts to cue again. Mom made aware of O/P services, breastfeeding support groups, community resources, and our phone # for post-discharge questions.  Reviewed bf basics.  Mom encouraged to feed baby 8-12 times/24 hours and with feeding cues.    Patient Name: Ellen Fulton ReekCrystal Rowe Today's Date: 02/07/2015 Reason for consult: Initial assessment   Maternal Data Has patient been taught Hand Expression?: Yes Does the patient have breastfeeding experience prior to this delivery?: Yes  Feeding Feeding Type: Breast Fed Length of feed: 20 min  LATCH Score/Interventions                      Lactation Tools Discussed/Used     Consult Status Consult Status: Follow-up Date: 02/08/15 Follow-up type: In-patient    Dahlia ByesBerkelhammer, Dameir Gentzler [facility] 02/07/2015, 4:56 PM

## 2015-02-08 LAB — CBC
HEMATOCRIT: 29.8 % — AB (ref 36.0–46.0)
HEMOGLOBIN: 10.3 g/dL — AB (ref 12.0–15.0)
MCH: 33 pg (ref 26.0–34.0)
MCHC: 34.6 g/dL (ref 30.0–36.0)
MCV: 95.5 fL (ref 78.0–100.0)
PLATELETS: 185 10*3/uL (ref 150–400)
RBC: 3.12 MIL/uL — ABNORMAL LOW (ref 3.87–5.11)
RDW: 14.4 % (ref 11.5–15.5)
WBC: 8.1 10*3/uL (ref 4.0–10.5)

## 2015-02-08 LAB — BIRTH TISSUE RECOVERY COLLECTION (PLACENTA DONATION)

## 2015-02-08 MED ORDER — ACETAMINOPHEN FOR CIRCUMCISION 160 MG/5 ML
40.0000 mg | Freq: Once | ORAL | Status: DC
  Filled 2015-02-08: qty 2.5

## 2015-02-08 MED ORDER — EPINEPHRINE TOPICAL FOR CIRCUMCISION 0.1 MG/ML: 1.0000 [drp] | TOPICAL | Status: DC | PRN

## 2015-02-08 MED ORDER — SUCROSE 24% NICU/PEDS ORAL SOLUTION
0.5000 mL | OROMUCOSAL | Status: DC | PRN
  Filled 2015-02-08: qty 0.5

## 2015-02-08 MED ORDER — FERROUS SULFATE 325 (65 FE) MG PO TABS
325.0000 mg | ORAL_TABLET | Freq: Every day | ORAL | Status: DC
  Administered 2015-02-10: 325 mg via ORAL
  Filled 2015-02-08 (×2): qty 1

## 2015-02-08 MED ORDER — ACETAMINOPHEN FOR CIRCUMCISION 160 MG/5 ML
40.0000 mg | ORAL | Status: DC | PRN
  Filled 2015-02-08: qty 2.5

## 2015-02-08 MED ORDER — LIDOCAINE 1%/NA BICARB 0.1 MEQ INJECTION
0.8000 mL | INJECTION | Freq: Once | INTRAVENOUS | Status: DC
Start: 2015-02-08 — End: 2015-02-08
  Filled 2015-02-08: qty 1

## 2015-02-08 NOTE — Progress Notes (Signed)
POD # 1  Subjective: Pt reports feeling well/ Pain controlled with Motrin and Percocet Tolerating po/ Foley d/c'd and due to void/ No n/v/ Flatus present Activity: ad lib Bleeding is light Newborn info:  Information for the patient's newborn:  Dolphus Jennyurner, Boy Liliann [[id number]][[id number]]  female   Circumcision: done/ Feeding: breast   Objective:  VS:  Filed Vitals:   02/07/15 2010 02/07/15 2204 02/08/15 0130 02/08/15 0530  BP:  125/71 117/65 137/87  Pulse:  112 102 98  Temp:  98.8 F (37.1 C) 99.7 F (37.6 C) 98.7 F (37.1 C)  TempSrc:  Oral Oral Oral  Resp:  18 18 18   Height:      Weight:      SpO2: 95% 95% 95% 95%     I&O: Intake/Output      05/13 0701 - 05/14 0700 05/14 0701 - 05/15 0700   P.O. 960    I.V. (mL/kg) 1500 (14.6)    Total Intake(mL/kg) 2460 (23.9)    Urine (mL/kg/hr) 3475 (1.4)    Blood 25 (0)    Total Output 3500     Net -1040             Recent Labs  02/06/15 1500 02/08/15 0620  WBC 6.2 8.1  HGB 11.9* 10.3*  HCT 33.9* 29.8*  PLT 224 185    Blood type: --/--/O POS, O POS (05/12 1500) Rubella: Immune (10/20 0000)    Physical Exam:  General: alert and cooperative CV: Regular rate and rhythm Resp: CTA bilaterally Abdomen: soft, nontender, normal bowel sounds Incision: healing well, no drainage, no erythema, no hernia, no seroma, no swelling, well approximated with staples, honeycomb dsg c/d/i Uterine Fundus: firm, below umbilicus, nontender Lochia: minimal Ext: edema 1+ BLE and Homans sign is negative, no sign of DVT    Assessment: POD # 1/ G3P1001/ S/P C/Section d/t arrest of dilation and excision of rt ovarian mass Mild ABL anemia Doing well  Plan: Ambulate TID in halls Continue routine post op orders   Signed: Donette LarryBHAMBRI, Hansika Leaming, Dorris CarnesN, MSN, CNM 02/08/2015, 9:48 AM

## 2015-02-08 NOTE — Lactation Note (Signed)
This note was copied from the chart of Ellen Rowe. Lactation Consultation Note  Patient Name: Ellen Fulton ReekCrystal Wherry AVWUJ'WToday's Date: 02/08/2015 Reason for consult: Follow-up assessment  With this mom of a term infant, now 8432 hours old. Mom has had breast edema, making it difficult for the baby to get a deep latch. Mom has shells to wear, and her bra was found by a family member to help her . The baby did latch with strong suckles. He had some dimpling, bu t visible swallows were seen. Mom has begun to transitiion into mature milk - her breasts are full and heavy, with easily expressed colostrum/milk. Mom's nurse, donn E. Has been helping mom with breastfeeding today. Mom knows to call for questions/concerns.    Maternal Data    Feeding    LATCH Score/Interventions Latch: Grasps breast easily, tongue down, lips flanged, rhythmical sucking.  Audible Swallowing: Spontaneous and intermittent Intervention(s): Skin to skin;Hand expression Intervention(s): Skin to skin  Type of Nipple: Everted at rest and after stimulation (Areolar edema)  Comfort (Breast/Nipple): Filling, red/small blisters or bruises, mild/mod discomfort  Problem noted: Mild/Moderate discomfort  Hold (Positioning): Assistance needed to correctly position infant at breast and maintain latch. (Working on deep latch - avoiding dimpling)  LATCH Score: 8  Lactation Tools Discussed/Used     Consult Status Consult Status: Follow-up Date: 02/09/15 Follow-up type: In-patient    Alfred LevinsLee, Heavan Francom Anne 02/08/2015, 3:16 PM

## 2015-02-08 NOTE — Op Note (Signed)
NAMFulton Reek:  Bonzo, Precious              ACCOUNT NO.:  [id number]  MEDICAL RECORD NO.:  [id number]  LOCATION:  9132                          FACILITY:  [facility]  PHYSICIAN:  Maxie BetterSheronette Schneur Crowson, M.D.DATE OF BIRTH:  [birth year]  DATE OF PROCEDURE:  02/07/2015 DATE OF DISCHARGE:                              OPERATIVE REPORT   PREOPERATIVE DIAGNOSES: 1. Arrest of dilatation. 2. Polyhydramnios. 3. Term gestation.  PROCEDURES: 1. Primary cesarean section Kerr hysterotomy. 2.  Excision of right ovarian mass.  POSTOPERATIVE DIAGNOSES: 1. Right ovarian mass. 2. Fetal macrosomia. 3. Polyhydramnios. 4. Term gestation. 5. Arrest of dilatation.  ANESTHESIA:  Epidural.  SURGEON:  Maxie BetterSheronette Mattison Stuckey, M.D.  ASSISTANT:  Marlinda Mikeanya Bailey, CNM  DESCRIPTION OF PROCEDURE:  Under adequate epidural anesthesia, the patient was placed in a supine position with a left lateral tilt.  She was sterilely prepped and draped in usual fashion.  Indwelling Foley catheter was already in place.  0.25% Marcaine was injected along the planned Pfannenstiel skin incision site.  Pfannenstiel skin incision was then made, carried down to the rectus fascia.  Rectus fascia was opened transversely.  Rectus fascia was then bluntly and sharply dissected off the rectus muscle in superior and inferior fashion.  The rectus muscle was split in midline.  The parietal peritoneum was entered bluntly and extended.  Bladder retractor was then placed.  Vesicouterine peritoneum was opened transversely.  The bladder was then bluntly dissected off the lower uterine segment, displaced inferiorly with a bladder retractor.  A curvilinear low-transverse uterine incision was then made and extended with bandage scissors.  Subsequent delivery of a live female from the direct occiput posterior presentation with cord next to the body was accomplished.  Baby was bulb suctioned in the abdomen, cord was clamped and cut.  The baby was transferred to the  awaiting pediatrician who assigned Apgars of 8 and 9 at one and five minutes.  The placenta was posterior, spontaneous intact, not sent.  Uterine cavity was then cleaned of debris.  Uterine incision had a small extension in the midportion less than 1 cm, which was closed separately with 0-Monocryl suture.  The remaining incision was closed in 2 layers, the first layer with 0-Monocryl running locked stitch, second layer was imbricated using 0-Monocryl suture.  Good hemostasis was noted.  Abdomen was irrigated and suctioned of debris.  Attention was then turned to the adnexa.  Both fallopian tubes were normal.  The left ovary was normal.  The right ovary had a distal about 2.5 cm mass, which separated from the main ovary by stalk.  This was removed with cautery and carefully dissected away from the fallopian tube.  It was sent to Pathology.  Interceed was then placed over the lower uterine segment in inverted T fashion. Parietal peritoneum was then closed with 2-0 Vicryl.  The rectus fascia was then closed with 0-Vicryl x2.  The subcutaneous area was irrigated, small bleeders cauterized.  Interrupted 2-0 plain sutures placed, and the skin approximated using Ethicon staples.  SPECIMEN:  The right ovarian mass, sent to Pathology.  Placenta was not sent.  ESTIMATED BLOOD LOSS:  700 mL.  INTRAOPERATIVE FLUIDS:  2500 mL.  URINE OUTPUT:  250 mL.  COUNTS:  Sponge and instrument counts x2 was correct.  COMPLICATIONS:  None.  The patient tolerated the procedure well, was transferred to recovery in stable condition. Baby was placed skin to skin.     SheronMaxie Betterette Traxton Kolenda, M.D.     [place]/MEDQ  D:  02/07/2015  T:  02/08/2015  Job:  [id number]

## 2015-02-09 NOTE — Progress Notes (Addendum)
Night shift RN set up an electric pump the night before. Night shift encouraged mom to pump today. Previous shift RN stated that mom was not pumping adequately. Mom did pump after 1930 and was able to pump breast milk. Night shift RN asked mom to call out so RN can demonstrate how to cup feed baby breast milk. Several RNs have given mom instructions for breast feeding positioning which will improve feedings and deepen latch.

## 2015-02-09 NOTE — Progress Notes (Signed)
POSTOPERATIVE DAY # 2 S/P CS   S:         Reports feeling a little tired and sore today             Tolerating po intake / no nausea / no vomiting / + flatus / no BM             Bleeding is light             Pain controlled with motrin and precocet             Up ad lib / ambulatory/ voiding QS  Newborn breast feeding   O:  VS: BP 112/70 mmHg  Pulse 93  Temp(Src) 98.4 F (36.9 C) (Oral)  Resp 18  Ht 5\' 1"  (1.549 m)  Wt 102.967 kg (227 lb)  BMI 42.91 kg/m2  SpO2 97%  Breastfeeding? Unknown   LABS:               Recent Labs  02/06/15 1500 02/08/15 0620  WBC 6.2 8.1  HGB 11.9* 10.3*  PLT 224 185               Bloodtype: --/--/O POS, O POS (05/12 1500)  Rubella: Immune (10/20 0000)                                             I&O: Intake/Output      05/14 0701 - 05/15 0700 05/15 0701 - 05/16 0700   P.O. 720    I.V. (mL/kg)     Total Intake(mL/kg) 720 (7)    Urine (mL/kg/hr) 1000 (0.4)    Blood     Total Output 1000     Net -280                       Physical Exam:             Alert and Oriented X3  Lungs: Clear and unlabored  Heart: regular rate and rhythm / no mumurs  Abdomen: soft, non-tender, non-distended, active BS             Fundus: firm, non-tender, U-1             Dressing intact honeycomb              Incision:  approximated with staples / no erythema / no ecchymosis / no drainage  Perineum: intact  Lochia: light  Extremities: 1+ edema, no calf pain or tenderness, negative Homans  A:        POD # 2 S/P CS            Mild ABL anemia - stable  P:        Routine postoperative care              Anticipate DC tomorrow - staple removal at [facility] with nurse   Marlinda MikeBAILEY, TANYA CNM, MSN, [facility] 02/09/2015, 12:20 PM

## 2015-02-09 NOTE — Lactation Note (Signed)
This note was copied from the chart of Boy Liridona Corradi. Lactation Consultation Note  Patient Name: Boy Fulton ReekCrystal Gaulin ZOXWR'UToday's Date: 02/09/2015 Reason for consult: Follow-up assessment  With this mom of a term baby, now  3451 hours old. Mom's milk has transitioned in, but she is very engorged, making it difficult for baby to get a deep latch. I had mom pump with DEP and 21 flanges. This helped define her nipple,  But her breasts ar so hard, they are very difficult to compress for a good latch. Mom was albe to express 1 ml of transitional milk from the right breast, and drops from the left.  I advised mom ot pump for 15 min at least every 2 hours, , have dad hlep with massage wile pumping. Also for mom to lay flat in bed, and massage her breasts toward her armpits, to help relieve engorgement. This can take up to 30-40 minutes to achieve.  Anette Riedeloah showing signs of being very hungry . He is still dimpling with latch, but appears to be swallowing. Mom knows to call lactation for questions/concerns.    Maternal Data    Feeding Feeding Type: Breast Fed  LATCH Score/Interventions Latch: Repeated attempts needed to sustain latch, nipple held in mouth throughout feeding, stimulation needed to elicit sucking reflex. (pre pumping to evert nipploe from swollen areola) Intervention(s): Adjust position;Assist with latch;Breast massage  Audible Swallowing: A few with stimulation Intervention(s): Skin to skin;Hand expression  Type of Nipple: Everted at rest and after stimulation  Comfort (Breast/Nipple): Engorged, cracked, bleeding, large blisters, severe discomfort Problem noted: Engorgment Intervention(s): Ice;Hand expression;Cabbage leaves;Other (comment) (massage with oil toward armpit with mom laying lat in bed)     Hold (Positioning): Assistance needed to correctly position infant at breast and maintain latch.  LATCH Score: 5  Lactation Tools Discussed/Used     Consult Status Consult  Status: Complete Follow-up type: Call as needed    Alfred LevinsLee, Eveline Sauve Anne 02/09/2015, 10:25 AM

## 2015-02-10 ENCOUNTER — Encounter (HOSPITAL_COMMUNITY): Payer: Self-pay | Admitting: Obstetrics and Gynecology

## 2015-02-10 DIAGNOSIS — R030 Elevated blood-pressure reading, without diagnosis of hypertension: Secondary | ICD-10-CM | POA: Diagnosis present

## 2015-02-10 MED ORDER — IBUPROFEN 600 MG PO TABS: 600.0000 mg | ORAL_TABLET | Freq: Four times a day (QID) | ORAL | Status: DC

## 2015-02-10 MED ORDER — OXYCODONE-ACETAMINOPHEN 5-325 MG PO TABS: 1.0000 | ORAL_TABLET | ORAL | Status: DC | PRN

## 2015-02-10 NOTE — Discharge Summary (Signed)
DISCHARGE SUMMARY:  Patient ID: Ellen Rowe MRN: [id number] DOB/AGE: 35/10/1980 35 y.o.  Admit date: 02/06/2015 Admission Diagnoses: 39.4 weeks, polyhydramnios   Discharge date: 02/10/2015 Discharge Diagnoses: S/P C/S on 02/07/15, Mild ABL anemia        Prenatal history: G3P1001   EDC: 02/09/2015, by Other Basis  [facility] since [redacted] wks gestation. Primary provider: Dr. Cherly Hensenousins Prenatal course complicated by SS trait, polyhydramnios  Prenatal labs: ABO, Rh: --/--/O POS, O POS (05/12 1500)  Antibody: NEG (05/12 1500) Rubella:   Immune RPR: Non Reactive (05/12 1500)  HBsAg: Negative (10/20 0000)  HIV: Non-reactive (10/20 0000)  GBS: Negative (04/20 0000)  GTT: 132  Medical / Surgical History :  Past medical history:  Past Medical History  Diagnosis Date  . Sickle cell trait   . Scoliosis     Past surgical history:  Past Surgical History  Procedure Laterality Date  . Wisdom tooth extraction  2000     Medications on Admission: Prescriptions prior to admission  Medication Sig Dispense Refill Last Dose  . Prenatal Vit-Fe Fumarate-FA (MULTIVITAMIN-PRENATAL) 27-0.8 MG TABS tablet Take 1 tablet by mouth daily at 12 noon.   02/05/2015 at Unknown time    Allergies: Review of patient's allergies indicates no known allergies.   Intrapartum Course:  Admitted for IOL, cervical balloon, Pitocin, AROM, epidural, arrest of dilation @4cm , primary CS.    Postpartum Course: Complicated by mild ABL anemia  Physical Exam:   VSS: Blood pressure 140/87, pulse 86, temperature 98.3 F (36.8 C), temperature source Oral, resp. rate 18, height 5\' 1"  (1.549 m), weight 102.967 kg (227 lb), SpO2 98 %, unknown if currently breastfeeding.  LABS:  Recent Labs  02/08/15 0620  WBC 8.1  HGB 10.3*  PLT 185    General: alert and oriented x3 Heart: RRR Lungs: CTA bilaterally GI: soft, non-tender, non-distended, BS x4 Lochia: small Uterus: firm  below umbilicus Incision: well approximated w/staples; honeycomb dressing-no significant erythema, drainage, or edema Extremities: 1+ BLE edema, Homans neg   Newborn Data Live born female  Birth Weight: 9 lb 3.4 oz (4180 g) APGAR: 8, 9  See operative report for further details  Home with mother.  Discharge Instructions:  Wound Care: keep clean and dry / remove honeycomb POD 6 Postpartum Instructions: [facility] discharge booklet - instructions reviewed Medications:    Medication List    TAKE these medications        ibuprofen 600 MG tablet  Commonly known as:  ADVIL,MOTRIN  Take 1 tablet (600 mg total) by mouth every 6 (six) hours.     multivitamin-prenatal 27-0.8 MG Tabs tablet  Take 1 tablet by mouth daily at 12 noon.     oxyCODONE-acetaminophen 5-325 MG per tablet  Commonly known as:  PERCOCET/ROXICET  Take 1-2 tablets by mouth every 4 (four) hours as needed (for pain scale greater than 7).            Follow-up Information    Follow up with Serita KyleOUSINS,Maicee Ullman A, MD.   Specialty:  Obstetrics and Gynecology   Why:  staple removal with RN   Contact information:   [address] [place] [id number] [phone]       Follow up with Embrie Mikkelsen A, MD. Schedule an appointment as soon as possible for a visit in 6 weeks.   Specialty:  Obstetrics and Gynecology   Contact information:   [address] [place] [id number] [phone]  Signed: Donette LarryBHAMBRI, MELANIE, N MSN, CNM 02/10/2015, 9:14 AM

## 2015-02-10 NOTE — Lactation Note (Addendum)
This note was copied from the chart of Ellen Tamala Hedgepath. Lactation Consultation Note Baby had 8% weight loss. I don't think the baby is getting the milk transfer d/t mom engorged. Encouraged to massage as BF. Mom having difficulty getting relief from engorgement. Has DEBP, hand pump, ICE pack, and cabbage. Mom stated she has used the cabbage several times, and when she pumps she only gets a little bit out. Demonstrated hand expression, breast massage. Breast has a lot of edema to them w/engorgement as well. Firm hard ridges under breast w/pitting edema. ICE applied. I feel like edema is a big factor in prohibiting milk relief. Baby latched in football position to Rt. breast and massage breast, heard gulping, noted significant relief in breast. Still has firmness to outer and lower area. Only able to hand express 0.275ml. Hand pump applied and only able to get a few drops.  Lt. Breast firm w/pitting edema and ridge to lower breast. Massage, ICE and cabbage applied. Hand expression only able to express 2ml after a lot of massaging, and hand pumping.  Encouraged mom to wear supportive bra today, laid her back to assist in draining edema. Encouraged to breast feed and pump every 2 hours. Needs to empty down breast more if possible.  Mom stated she had this same problem with her 1st child. Patient Name: Ellen Fulton ReekCrystal Rowe ZOXWR'UToday's Date: 02/10/2015 Reason for consult: Follow-up assessment;Infant weight loss   Maternal Data    Feeding Feeding Type: Breast Fed Length of feed: 20 min  LATCH Score/Interventions Latch: Grasps breast easily, tongue down, lips flanged, rhythmical sucking. Intervention(s): Adjust position;Assist with latch;Breast massage;Breast compression  Audible Swallowing: Spontaneous and intermittent Intervention(s): Hand expression Intervention(s): Hand expression;Alternate breast massage  Type of Nipple: Everted at rest and after stimulation  Comfort (Breast/Nipple): Engorged,  cracked, bleeding, large blisters, severe discomfort Problem noted: Engorgment Intervention(s): Ice;Hand expression;Reverse pressure;Cabbage leaves;Other (comment) (pumping)  Interventions (Mild/moderate discomfort): Hand massage;Hand expression;Reverse pressue;Post-pump;Pre-pump if needed  Hold (Positioning): Assistance needed to correctly position infant at breast and maintain latch. Intervention(s): Breastfeeding basics reviewed;Support Pillows;Position options;Skin to skin  LATCH Score: 7  Lactation Tools Discussed/Used Tools: Pump Breast pump type: Double-Electric Breast Pump Pump Review: Setup, frequency, and cleaning;Milk Storage   Consult Status Consult Status: Follow-up Date: 02/10/15 Follow-up type: In-patient    Charyl DancerCARVER, Ellen Rowe 02/10/2015, 5:41 AM

## 2015-02-10 NOTE — Progress Notes (Addendum)
POD # 3  Subjective: Pt reports feeling well-little upset d/t father just admitted to hospital this am/ Pain controlled with Motrin and Percocet Tolerating po/Voiding without problems/ No n/v/ Flatus present No HA, visual disturbances, or epigastric pain Activity: ad lib Bleeding is light Newborn info:  Information for the patient's newborn:  Dolphus Jennyurner, Boy Shayleigh [[id number]][[id number]]  female  / Circumcision: done/ Feeding: breast   Objective: VS: VS:  Filed Vitals:   02/08/15 1812 02/09/15 0620 02/09/15 1816 02/10/15 0457  BP: 135/85 112/70 140/91 140/87  Pulse: 105 93 94 86  Temp: 98.4 F (36.9 C) 98.4 F (36.9 C) 98.2 F (36.8 C) 98.3 F (36.8 C)  TempSrc: Oral Oral Oral Oral  Resp: 20 18 18 18   Height:      Weight:      SpO2: 97%  98%     I&O: Intake/Output      05/15 0701 - 05/16 0700 05/16 0701 - 05/17 0700   P.O.     Total Intake(mL/kg)     Urine (mL/kg/hr)     Total Output       Net              LABS:  Recent Labs  02/08/15 0620  WBC 8.1  HGB 10.3*  PLT 185   Blood type: --/--/O POS, O POS (05/12 1500) Rubella: Immune (10/20 0000)           Physical Exam:  General: alert and cooperative CV: Regular rate and rhythm Resp: CTA bilaterally Abdomen: soft, nontender, normal bowel sounds Incision: healing well, no drainage, no erythema, no hernia, no seroma, no swelling, well approximated w/staples. Honeycomb 3/4 detached-removed remainder and discarded Uterine Fundus: firm, below umbilicus, nontender Lochia: minimal Ext: edema 1+ BLE and Homans sign is negative, no sign of DVT    Assessment: POD # 3/ G3P1001/ S/P C/Section d/t arrest of dilation Mild ABL anemia - stable Borderline HTN Doing well and stable for discharge home  Plan: Replace honeycomb dsg prior to d/c Discharge home RX's: Ibuprofen 600mg  po Q 6 hrs prn pain #30 Refill x 1 Percocet 5/325 1 - 2 tabs po every 4 hrs prn pain #30 Refill x 0 PNV 1 po daily (has Rx) [facility] booklet  given Postpartum check in 6 wks at [facility] Staple removal and blood pressure check in 4 days at [facility]   Signed: Donette LarryBHAMBRI, Tyree Vandruff, N, MSN, CNM 02/10/2015, 9:08 AM

## 2015-02-10 NOTE — Lactation Note (Signed)
This note was copied from the chart of Ellen Rowe. Lactation Consultation Note  Patient Name: Ellen Fulton ReekCrystal Goren ZOXWR'UToday's Date: 02/10/2015 Reason for consult: Follow-up assessment Baby 76 hours of life. Mom engorged, but has baby latched to left breast in football position. Baby latched deeply, suckling rhythmically, with intermittent swallows noted. Baby nursed on left breast for 20 minutes, and LC assisted mom to hand express and use manual pump to start right breast flowing. Baby pulled off left breast to sleep. Changed baby's diaper and baby cueing to nurse again. Baby latched well in cross-cradle position to right breast, but suckling less assertively with only a few swallows noted. Baby pulled off after 20 minutes, asleep, and continued to sleep, seemingly satiated in crib. Assisted mom to use DEBP. Mom's transitional milk did flow with use of pump, and mom's breasts are softer after pumping. Plan is for mom to use ice/frozen vegetables in between feeds, 10-15 minutes on and 10-15 minutes off. Mom is to feed baby with cues, then massage and use hand pump/DEBP to soften breasts further. Mom enc to pre-pump/hand express if baby not able to latch due to over-full breasts. Mom states that she has increased comfort after pumping, and she understands the need to keep her milk flowing. Mom reports that her milk came in early with her first child, who was in the NICU for a few days, and that she was engorged with first child as well. However, mom states that she was able to produce enough milk to nurse/bottle-feed first baby for 5 months.  Mom referred to Baby and Me booklet for number of diapers to expect by day of life and EBM storage guidelines. Mom aware of OP/BFSG and Lc phone assistance after D/C. Mom states that she has a DEBP at home. Discussed assessment, interventions, and plan with pediatrician Dr. Janee Mornhompson. Enc mom to call for assistance as needed.   Maternal Data    Feeding Feeding  Type: Breast Fed Length of feed: 40 min  LATCH Score/Interventions Latch: Grasps breast easily, tongue down, lips flanged, rhythmical sucking.  Audible Swallowing: Spontaneous and intermittent  Type of Nipple: Everted at rest and after stimulation  Comfort (Breast/Nipple): Engorged, cracked, bleeding, large blisters, severe discomfort Problem noted: Engorgment Intervention(s): Ice;Hand expression  Problem noted: Filling;Mild/Moderate discomfort Interventions (Filling): Hand pump;Double electric pump;Frequent nursing Interventions (Mild/moderate discomfort): Hand massage;Pre-pump if needed  Hold (Positioning): No assistance needed to correctly position infant at breast.  LATCH Score: 8  Lactation Tools Discussed/Used Tools: 21F feeding tube / Syringe   Consult Status Consult Status: PRN    Geralynn OchsWILLIARD, Anona Giovannini 02/10/2015, 11:06 AM

## 2015-02-14 ENCOUNTER — Ambulatory Visit (HOSPITAL_COMMUNITY): Admit: 2015-02-14 | Payer: BC Managed Care – PPO

## 2015-08-17 ENCOUNTER — Encounter (HOSPITAL_COMMUNITY): Payer: Self-pay | Admitting: Emergency Medicine

## 2015-08-17 ENCOUNTER — Emergency Department (HOSPITAL_COMMUNITY)
Admission: EM | Admit: 2015-08-17 | Discharge: 2015-08-17 | Disposition: A | Discharge status: Home / Self Care | Payer: BC Managed Care – PPO | Source: Home / Self Care

## 2015-08-17 DIAGNOSIS — H18822 Corneal disorder due to contact lens, left eye: Secondary | ICD-10-CM

## 2015-08-17 MED ORDER — FLUORESCEIN-BENOXINATE 0.25-0.4 % OP SOLN: 2.0000 [drp] | Freq: Once | OPHTHALMIC | Status: DC

## 2015-08-17 MED ORDER — FLUORESCEIN SODIUM 1 MG OP STRP
ORAL_STRIP | OPHTHALMIC | Status: AC
  Filled 2015-08-17: qty 10

## 2015-08-17 MED ORDER — ERYTHROMYCIN 5 MG/GM OP OINT: TOPICAL_OINTMENT | OPHTHALMIC | Status: DC

## 2015-08-17 NOTE — ED Notes (Signed)
C/o left eye irritation onset Friday associated w/ redness, and BV... Sx increase w/bright light Denies inj/trauma A&O x4... No acute distress.

## 2015-08-17 NOTE — Discharge Instructions (Signed)
Corneal Abrasion °The cornea is the clear covering at the front and center of the eye. When you look at the colored portion of the eye, you are looking through the cornea. It is a thin tissue made up of layers. The top layer is the most sensitive layer. A corneal abrasion happens if this layer is scratched or an injury causes it to come off.  °HOME CARE °· You may be given drops or a medicated cream. Use the medicine as told by your doctor. °· A pressure patch may be put over the eye. If this is done, follow your doctor's instructions for when to remove the patch. Do not drive or use machines while the eye patch is on. Judging distances is hard to do with a patch on. °· See your doctor for a follow-up exam if you are told to do so. It is very important that you keep this appointment. °GET HELP IF:  °· You have pain, are sensitive to light, and have a scratchy feeling in one eye or both eyes. °· Your pressure patch keeps getting loose. You can blink your eye under the patch. °· You have fluid coming from your eye or the lids stick together in the morning. °· You have the same symptoms in the morning that you did with the first abrasion. This could be days, weeks, or months after the first abrasion healed. °  °This information is not intended to replace advice given to you by your health care provider. Make sure you discuss any questions you have with your health care provider. °  °Document Released: 03/01/2008 Document Revised: 06/04/2015 Document Reviewed: 05/21/2013 °Elsevier Interactive Patient Education ©2016 [employer]vier Inc. ° °

## 2015-08-17 NOTE — ED Provider Notes (Signed)
CSN: [id number]     Arrival date & time 08/17/15  1445 History   None    Chief Complaint  Patient presents with  . Eye Problem   (Consider location/radiation/quality/duration/timing/severity/associated sxs/prior Treatment) HPI History obtained from patient:   LOCATION: Left eye SEVERITY: 8 DURATION: Friday CONTEXT: Onset of pain after removing contact lens QUALITY: Photophobia MODIFYING FACTORS: Cool compresses ASSOCIATED SYMPTOMS: Tearing photophobia TIMING: Constant OCCUPATION: Teacher  Past Medical History  Diagnosis Date  . Sickle cell trait (HCC)   . Scoliosis    Past Surgical History  Procedure Laterality Date  . Wisdom tooth extraction  2000  . Cesarean section N/A 02/07/2015    Procedure: CESAREAN SECTION;  Surgeon: Maxie Better, MD;  Location: [facility];  Service: Obstetrics;  Laterality: N/A;   Family History  Problem Relation Age of Onset  . Family history unknown: Yes   Social History  Substance Use Topics  . Smoking status: Never Smoker   . Smokeless tobacco: Never Used  . Alcohol Use: No   OB History    Gravida Para Term Preterm AB TAB SAB Ectopic Multiple Living   0 1     Review of Systems ROS +'ve left eye redness  Denies: HEADACHE, NAUSEA, ABDOMINAL PAIN, CHEST PAIN, CONGESTION, DYSURIA, SHORTNESS OF BREATH  Allergies  Review of patient's allergies indicates no known allergies.  Home Medications   Prior to Admission medications   Medication Sig Start Date End Date Taking? Authorizing Provider  ibuprofen (ADVIL,MOTRIN) 600 MG tablet Take 1 tablet (600 mg total) by mouth every 6 (six) hours. 02/10/15   Lawernce Pitts, CNM  oxyCODONE-acetaminophen (PERCOCET/ROXICET) 5-325 MG per tablet Take 1-2 tablets by mouth every 4 (four) hours as needed (for pain scale greater than 7). 02/10/15   Lawernce Pitts, CNM  Prenatal Vit-Fe Fumarate-FA (MULTIVITAMIN-PRENATAL) 27-0.8 MG TABS tablet Take 1 tablet by mouth daily at 12 noon.     Historical Provider, MD   Meds Ordered and Administered this Visit   Medications  fluorescein-benoxinate (FLURATE) ophthalmic solution 2 drop (not administered)    BP 171/109 mmHg  Pulse 92  Temp(Src) 97.9 F (36.6 C) (Oral)  Resp 18  SpO2 97%  LMP 04/11/2015 No data found.   Physical Exam  Constitutional: She is oriented to person, place, and time. She appears well-developed and well-nourished.  Eyes: Lids are normal. Lids are everted and swept, no foreign bodies found. Right eye exhibits no chemosis. Left eye exhibits no chemosis and no exudate. Left conjunctiva is injected. Left conjunctiva has no hemorrhage. No scleral icterus. Left eye exhibits normal extraocular motion and no nystagmus. Left pupil is round and reactive. Pupils are equal.    Neurological: She is alert and oriented to person, place, and time.  Skin: Skin is warm and dry.  Psychiatric: She has a normal mood and affect. Her behavior is normal. Judgment and thought content normal.  Nursing note and vitals reviewed.   ED Course  Procedures (including critical care time)  Labs Review Labs Reviewed - No data to display  Imaging Review No results found.   Visual Acuity Review  Right Eye Distance: 20/30 w/o contact Left Eye Distance: 20/70 w/o contact Bilateral Distance:    Right Eye Near:   Left Eye Near:    Bilateral Near:         MDM   1. Corneal abrasion due to contact lens, left    Signs and symptoms are most consistent  with corneal abrasion secondary to contact lens. I have advised patient did not put her contact lens back in her eyes until she is evaluated by her doctor tomorrow. Patient states that she has already made an appointment with her eye physician. Prescription for erythromycin ointment is provided. Patient is advised to treat herself symptomatically including status sunlight aspirin for discomfort and will compresses. If there is no worsening of vision changes she should go to  the emergency department. Instructions of care provided discharged in stable condition.    Tharon AquasFrank C Renesha Lizama, PA 08/17/15 1707  Tharon AquasFrank C Paxtyn Wisdom, PA 08/17/15 [phone]

## 2019-11-20 ENCOUNTER — Ambulatory Visit: Payer: BC Managed Care – PPO | Attending: Internal Medicine

## 2019-11-20 DIAGNOSIS — Z20822 Contact with and (suspected) exposure to covid-19: Secondary | ICD-10-CM

## 2019-11-21 LAB — NOVEL CORONAVIRUS, NAA: SARS-CoV-2, NAA: NOT DETECTED

## 2019-11-22 ENCOUNTER — Ambulatory Visit: Payer: BC Managed Care – PPO

## 2019-12-05 ENCOUNTER — Ambulatory Visit: Payer: BC Managed Care – PPO | Attending: Internal Medicine

## 2019-12-05 DIAGNOSIS — Z20822 Contact with and (suspected) exposure to covid-19: Secondary | ICD-10-CM

## 2019-12-06 LAB — NOVEL CORONAVIRUS, NAA: SARS-CoV-2, NAA: NOT DETECTED

## 2020-11-07 ENCOUNTER — Other Ambulatory Visit: Payer: Self-pay | Admitting: Obstetrics and Gynecology

## 2020-11-07 DIAGNOSIS — O358XX0 Maternal care for other (suspected) fetal abnormality and damage, not applicable or unspecified: Secondary | ICD-10-CM

## 2020-11-07 DIAGNOSIS — O35BXX0 Maternal care for other (suspected) fetal abnormality and damage, fetal cardiac anomalies, not applicable or unspecified: Secondary | ICD-10-CM

## 2020-11-07 DIAGNOSIS — Q249 Congenital malformation of heart, unspecified: Secondary | ICD-10-CM

## 2020-11-07 DIAGNOSIS — Z363 Encounter for antenatal screening for malformations: Secondary | ICD-10-CM

## 2020-11-07 DIAGNOSIS — O09522 Supervision of elderly multigravida, second trimester: Secondary | ICD-10-CM

## 2020-11-14 ENCOUNTER — Ambulatory Visit: Payer: BC Managed Care – PPO

## 2020-11-20 ENCOUNTER — Ambulatory Visit: Payer: BC Managed Care – PPO

## 2020-11-20 ENCOUNTER — Encounter: Payer: Self-pay | Admitting: *Deleted

## 2020-11-20 ENCOUNTER — Ambulatory Visit: Payer: BC Managed Care – PPO | Attending: Obstetrics and Gynecology

## 2020-11-20 ENCOUNTER — Other Ambulatory Visit: Payer: Self-pay | Admitting: *Deleted

## 2020-11-20 ENCOUNTER — Ambulatory Visit: Payer: BC Managed Care – PPO | Admitting: *Deleted

## 2020-11-20 ENCOUNTER — Other Ambulatory Visit: Payer: Self-pay

## 2020-11-20 VITALS — BP 134/76 | HR 115

## 2020-11-20 DIAGNOSIS — E669 Obesity, unspecified: Secondary | ICD-10-CM

## 2020-11-20 DIAGNOSIS — Z362 Encounter for other antenatal screening follow-up: Secondary | ICD-10-CM

## 2020-11-20 DIAGNOSIS — O99212 Obesity complicating pregnancy, second trimester: Secondary | ICD-10-CM

## 2020-11-20 DIAGNOSIS — Q249 Congenital malformation of heart, unspecified: Secondary | ICD-10-CM | POA: Insufficient documentation

## 2020-11-20 DIAGNOSIS — O09522 Supervision of elderly multigravida, second trimester: Secondary | ICD-10-CM

## 2020-11-20 DIAGNOSIS — O358XX0 Maternal care for other (suspected) fetal abnormality and damage, not applicable or unspecified: Secondary | ICD-10-CM | POA: Diagnosis present

## 2020-11-20 DIAGNOSIS — Z363 Encounter for antenatal screening for malformations: Secondary | ICD-10-CM | POA: Diagnosis not present

## 2020-11-20 DIAGNOSIS — O34219 Maternal care for unspecified type scar from previous cesarean delivery: Secondary | ICD-10-CM

## 2020-11-20 DIAGNOSIS — O35BXX0 Maternal care for other (suspected) fetal abnormality and damage, fetal cardiac anomalies, not applicable or unspecified: Secondary | ICD-10-CM

## 2020-11-20 DIAGNOSIS — Z3A22 22 weeks gestation of pregnancy: Secondary | ICD-10-CM

## 2020-12-18 ENCOUNTER — Ambulatory Visit: Payer: BC Managed Care – PPO

## 2021-01-28 ENCOUNTER — Encounter: Payer: BC Managed Care – PPO | Attending: Obstetrics and Gynecology | Admitting: Registered"

## 2021-01-28 ENCOUNTER — Other Ambulatory Visit: Payer: Self-pay

## 2021-01-28 DIAGNOSIS — O24419 Gestational diabetes mellitus in pregnancy, unspecified control: Secondary | ICD-10-CM | POA: Diagnosis not present

## 2021-01-30 ENCOUNTER — Encounter: Payer: Self-pay | Admitting: Registered"

## 2021-01-30 ENCOUNTER — Other Ambulatory Visit: Payer: Self-pay | Admitting: Obstetrics and Gynecology

## 2021-01-30 DIAGNOSIS — O26843 Uterine size-date discrepancy, third trimester: Secondary | ICD-10-CM

## 2021-01-30 DIAGNOSIS — Z363 Encounter for antenatal screening for malformations: Secondary | ICD-10-CM

## 2021-01-30 DIAGNOSIS — Z3A32 32 weeks gestation of pregnancy: Secondary | ICD-10-CM

## 2021-01-30 DIAGNOSIS — O24419 Gestational diabetes mellitus in pregnancy, unspecified control: Secondary | ICD-10-CM | POA: Insufficient documentation

## 2021-01-30 NOTE — Progress Notes (Signed)
Patient was seen on 01/28/2021 for Gestational Diabetes self-management class at the [facility]. The following learning objectives were met by the patient during this course:   States the definition of Gestational Diabetes  States why dietary management is important in controlling blood glucose  Describes the effects each nutrient has on blood glucose levels  Demonstrates ability to create a balanced meal plan  Demonstrates carbohydrate counting   States when to check blood glucose levels  Demonstrates proper blood glucose monitoring techniques  States the effect of stress and exercise on blood glucose levels  States the importance of limiting caffeine and abstaining from alcohol and smoking  Blood glucose monitor given: Patient has meter prior to class and is checking blood sugar prior to class  Patient instructed to monitor glucose levels: FBS: 60 - <95; 1 hour: <140; 2 hour: <120  Patient received handouts:  Nutrition Diabetes and Pregnancy, including carb counting list  Patient will be seen for follow-up as needed.

## 2021-02-04 ENCOUNTER — Ambulatory Visit: Payer: BC Managed Care – PPO

## 2021-02-05 ENCOUNTER — Ambulatory Visit: Payer: BC Managed Care – PPO | Attending: Obstetrics and Gynecology

## 2021-02-05 ENCOUNTER — Other Ambulatory Visit: Payer: Self-pay | Admitting: Obstetrics and Gynecology

## 2021-02-05 ENCOUNTER — Ambulatory Visit: Payer: BC Managed Care – PPO | Admitting: *Deleted

## 2021-02-05 ENCOUNTER — Encounter: Payer: Self-pay | Admitting: *Deleted

## 2021-02-05 ENCOUNTER — Other Ambulatory Visit: Payer: Self-pay

## 2021-02-05 ENCOUNTER — Other Ambulatory Visit: Payer: Self-pay | Admitting: *Deleted

## 2021-02-05 VITALS — BP 126/67 | HR 104

## 2021-02-05 DIAGNOSIS — Z3A32 32 weeks gestation of pregnancy: Secondary | ICD-10-CM | POA: Insufficient documentation

## 2021-02-05 DIAGNOSIS — O24410 Gestational diabetes mellitus in pregnancy, diet controlled: Secondary | ICD-10-CM

## 2021-02-05 DIAGNOSIS — O26843 Uterine size-date discrepancy, third trimester: Secondary | ICD-10-CM | POA: Insufficient documentation

## 2021-02-05 DIAGNOSIS — Z363 Encounter for antenatal screening for malformations: Secondary | ICD-10-CM | POA: Insufficient documentation

## 2021-02-25 ENCOUNTER — Ambulatory Visit: Payer: BC Managed Care – PPO

## 2021-03-06 ENCOUNTER — Ambulatory Visit: Payer: BC Managed Care – PPO

## 2021-03-12 ENCOUNTER — Encounter (HOSPITAL_COMMUNITY): Payer: Self-pay

## 2021-03-12 ENCOUNTER — Ambulatory Visit: Payer: BC Managed Care – PPO

## 2021-03-12 NOTE — Patient Instructions (Signed)
Ellen Rowe  03/12/2021   Your procedure is scheduled on:  03/19/2021  Arrive at 0530 at Entrance C on [address] at [facility]  and [employer]. You are invited to use the FREE valet parking or use the Visitor's parking deck.  Pick up the phone at the desk and dial [phone].  Call this number if you have problems the morning of surgery: [phone]  Remember:   Do not eat food:(After Midnight) Desps de medianoche.  Do not drink clear liquids: (After Midnight) Desps de medianoche.  Take these medicines the morning of surgery with A SIP OF WATER:  none   Do not wear jewelry, make-up or nail polish.  Do not wear lotions, powders, or perfumes. Do not wear deodorant.  Do not shave 48 hours prior to surgery.  Do not bring valuables to the hospital.  [facility] is not   responsible for any belongings or valuables brought to the hospital.  Contacts, dentures or bridgework may not be worn into surgery.  Leave suitcase in the car. After surgery it may be brought to your room.  For patients admitted to the hospital, checkout time is 11:00 AM the day of              discharge.      Please read over the following fact sheets that you were given:     Preparing for Surgery

## 2021-03-16 ENCOUNTER — Inpatient Hospital Stay (HOSPITAL_COMMUNITY)
Admission: AD | Admit: 2021-03-16 | Discharge: 2021-03-16 | Disposition: A | Discharge status: Home / Self Care | Payer: BC Managed Care – PPO | Source: Home / Self Care | Attending: Obstetrics and Gynecology | Admitting: Obstetrics and Gynecology

## 2021-03-16 ENCOUNTER — Other Ambulatory Visit: Payer: Self-pay

## 2021-03-16 ENCOUNTER — Inpatient Hospital Stay (HOSPITAL_BASED_OUTPATIENT_CLINIC_OR_DEPARTMENT_OTHER): Payer: BC Managed Care – PPO

## 2021-03-16 ENCOUNTER — Encounter (HOSPITAL_COMMUNITY): Payer: Self-pay | Admitting: Obstetrics and Gynecology

## 2021-03-16 DIAGNOSIS — O139 Gestational [pregnancy-induced] hypertension without significant proteinuria, unspecified trimester: Secondary | ICD-10-CM

## 2021-03-16 DIAGNOSIS — O34219 Maternal care for unspecified type scar from previous cesarean delivery: Secondary | ICD-10-CM | POA: Insufficient documentation

## 2021-03-16 DIAGNOSIS — Z3A38 38 weeks gestation of pregnancy: Secondary | ICD-10-CM | POA: Diagnosis not present

## 2021-03-16 DIAGNOSIS — O289 Unspecified abnormal findings on antenatal screening of mother: Secondary | ICD-10-CM

## 2021-03-16 DIAGNOSIS — O34211 Maternal care for low transverse scar from previous cesarean delivery: Secondary | ICD-10-CM | POA: Diagnosis not present

## 2021-03-16 DIAGNOSIS — Z7982 Long term (current) use of aspirin: Secondary | ICD-10-CM | POA: Insufficient documentation

## 2021-03-16 DIAGNOSIS — Z7984 Long term (current) use of oral hypoglycemic drugs: Secondary | ICD-10-CM | POA: Insufficient documentation

## 2021-03-16 DIAGNOSIS — O24415 Gestational diabetes mellitus in pregnancy, controlled by oral hypoglycemic drugs: Secondary | ICD-10-CM | POA: Insufficient documentation

## 2021-03-16 LAB — CBC WITH DIFFERENTIAL/PLATELET
Abs Immature Granulocytes: 0.08 10*3/uL — ABNORMAL HIGH (ref 0.00–0.07)
Basophils Absolute: 0 10*3/uL (ref 0.0–0.1)
Basophils Relative: 0 %
Eosinophils Absolute: 0 10*3/uL (ref 0.0–0.5)
Eosinophils Relative: 1 %
HCT: 34.4 % — ABNORMAL LOW (ref 36.0–46.0)
Hemoglobin: 12 g/dL (ref 12.0–15.0)
Immature Granulocytes: 1 %
Lymphocytes Relative: 20 %
Lymphs Abs: 1.2 10*3/uL (ref 0.7–4.0)
MCH: 33.1 pg (ref 26.0–34.0)
MCHC: 34.9 g/dL (ref 30.0–36.0)
MCV: 95 fL (ref 80.0–100.0)
Monocytes Absolute: 0.9 10*3/uL (ref 0.1–1.0)
Monocytes Relative: 15 %
Neutro Abs: 3.8 10*3/uL (ref 1.7–7.7)
Neutrophils Relative %: 63 %
Platelets: 244 10*3/uL (ref 150–400)
RBC: 3.62 MIL/uL — ABNORMAL LOW (ref 3.87–5.11)
RDW: 14.3 % (ref 11.5–15.5)
WBC: 6 10*3/uL (ref 4.0–10.5)
nRBC: 0 % (ref 0.0–0.2)

## 2021-03-16 LAB — PROTEIN / CREATININE RATIO, URINE
Creatinine, Urine: 23.41 mg/dL
Total Protein, Urine: <6 mg/dL

## 2021-03-16 LAB — COMPREHENSIVE METABOLIC PANEL
ALT: 8 U/L (ref 0–44)
AST: 16 U/L (ref 15–41)
Albumin: 3.1 g/dL — ABNORMAL LOW (ref 3.5–5.0)
Alkaline Phosphatase: 78 U/L (ref 38–126)
Anion gap: 10 (ref 5–15)
BUN: <5 mg/dL — ABNORMAL LOW (ref 6–20)
CO2: 23 mmol/L (ref 22–32)
Calcium: 9.4 mg/dL (ref 8.9–10.3)
Chloride: 104 mmol/L (ref 98–111)
Creatinine, Ser: 0.48 mg/dL (ref 0.44–1.00)
GFR, Estimated: >60 mL/min (ref >60)
Glucose, Bld: 78 mg/dL (ref 70–99)
Potassium: 3.9 mmol/L (ref 3.5–5.1)
Sodium: 137 mmol/L (ref 135–145)
Total Bilirubin: 1.2 mg/dL (ref 0.3–1.2)
Total Protein: 6.6 g/dL (ref 6.5–8.1)

## 2021-03-16 LAB — URIC ACID: Uric Acid, Serum: 4.2 mg/dL (ref 2.5–7.1)

## 2021-03-16 NOTE — MAU Note (Signed)
Ellen Rowe is a 40 y.o. at [redacted]w[redacted]d here in MAU reporting: sent over from the office for non reactive NST. Pt reports + FM. Denies pain, bleeding, and LOF. Having some pressure.  Onset of complaint: today  Pain score: 0/10  Vitals:   03/16/21 1121  BP: (!) 146/83  Pulse: (!) 123  Resp: 16  Temp: 98.6 F (37 C)  SpO2: 98%     FHT: EFM applied in room  Lab orders placed from triage: none

## 2021-03-16 NOTE — MAU Provider Note (Signed)
History     Chief Complaint  Patient presents with   Fetal Monitoring  40 yo [id number] MBF Class A2 GDM previous C/S now @ 38 4/[redacted] weeks gestation sent from the office due to prolonged reasuring but non reactive NST. Pt is scheduled for repeat C/S, TL on Thursday. Elevated BP noted here. Denies hh/a, visual changes, epigastric pain. (+) leg swelling   OB History     Gravida  4   Para  2   Term  2   Preterm      AB  1   Living  2      SAB  1   IAB      Ectopic      Multiple  0   Live Births  2           Past Medical History:  Diagnosis Date   Anemia    Gestational diabetes    Pregnancy induced hypertension    Scoliosis    Sickle cell trait (HCC)     Past Surgical History:  Procedure Laterality Date   CESAREAN SECTION N/A 02/07/2015   Procedure: CESAREAN SECTION;  Surgeon: Maxie Better, MD;  Location: [facility];  Service: Obstetrics;  Laterality: N/A;   WISDOM TOOTH EXTRACTION  2000    Family History  Problem Relation Age of Onset   Diabetes Mother    Hypertension Mother    Heart disease Father    Diabetes Father    Hypertension Father     Social History   Tobacco Use   Smoking status: Never   Smokeless tobacco: Never  Vaping Use   Vaping Use: Never used  Substance Use Topics   Alcohol use: No   Drug use: No    Allergies: No Known Allergies  Medications Prior to Admission  Medication Sig Dispense Refill Last Dose   Ascorbic Acid (VITAMIN C) 250 MG CHEW Chew 250 mg by mouth in the morning and at bedtime.      aspirin EC 81 MG tablet Take 81 mg by mouth daily. Swallow whole.      ferrous sulfate 325 (65 FE) MG tablet Take 325 mg by mouth 2 (two) times daily with a meal.      glyBURIDE (DIABETA) 2.5 MG tablet Take 2.5 mg by mouth every evening.      Prenatal Vit-Fe Fumarate-FA (MULTIVITAMIN-PRENATAL) 27-0.8 MG TABS tablet Take 2 tablets by mouth daily.        Physical Exam   Blood pressure (!) 143/90, pulse (!) 117, temperature 98.6  F (37 C), temperature source Oral, resp. rate 16, last menstrual period 06/19/2020, SpO2 98 %, unknown if currently breastfeeding.  General appearance: alert, cooperative, and no distress Lungs: clear to auscultation bilaterally Heart: regular rate and rhythm, S1, S2 normal, no murmur, click, rub or gallop Abdomen:  gravid Pelvic: deferred Extremities: edema 2-3+  Tracing: baseline 130 (+) accel to 150 ED Course  IMP: NR NST Class A2 GDM  Previous C/S Gestational HTN ? anxious P) BPP. PIH labs. Serial BPP MDM   Serita Kyle, MD 12:53 PM 03/16/2021

## 2021-03-16 NOTE — MAU Provider Note (Signed)
Pt sent from Dr Cherly Hensen office for a BPP d/t a non-reactive NST.

## 2021-03-17 ENCOUNTER — Encounter (HOSPITAL_COMMUNITY)
Admission: RE | Admit: 2021-03-17 | Discharge: 2021-03-17 | Disposition: A | Discharge status: Home / Self Care | Payer: BC Managed Care – PPO | Source: Ambulatory Visit | Attending: Obstetrics and Gynecology | Admitting: Obstetrics and Gynecology

## 2021-03-17 ENCOUNTER — Other Ambulatory Visit (HOSPITAL_COMMUNITY)
Admission: RE | Admit: 2021-03-17 | Discharge: 2021-03-17 | Disposition: A | Discharge status: Home / Self Care | Payer: BC Managed Care – PPO | Source: Ambulatory Visit | Attending: Obstetrics and Gynecology | Admitting: Obstetrics and Gynecology

## 2021-03-17 DIAGNOSIS — Z01812 Encounter for preprocedural laboratory examination: Secondary | ICD-10-CM | POA: Insufficient documentation

## 2021-03-17 DIAGNOSIS — Z20822 Contact with and (suspected) exposure to covid-19: Secondary | ICD-10-CM | POA: Insufficient documentation

## 2021-03-17 HISTORY — DX: Anemia, unspecified: D64.9

## 2021-03-17 HISTORY — DX: Gestational (pregnancy-induced) hypertension without significant proteinuria, unspecified trimester: O13.9

## 2021-03-17 HISTORY — DX: Gestational diabetes mellitus in pregnancy, unspecified control: O24.419

## 2021-03-17 LAB — CBC
HCT: 34.7 % — ABNORMAL LOW (ref 36.0–46.0)
Hemoglobin: 11.8 g/dL — ABNORMAL LOW (ref 12.0–15.0)
MCH: 33.3 pg (ref 26.0–34.0)
MCHC: 34 g/dL (ref 30.0–36.0)
MCV: 98 fL (ref 80.0–100.0)
Platelets: 237 10*3/uL (ref 150–400)
RBC: 3.54 MIL/uL — ABNORMAL LOW (ref 3.87–5.11)
RDW: 14.4 % (ref 11.5–15.5)
WBC: 5.6 10*3/uL (ref 4.0–10.5)
nRBC: 0 % (ref 0.0–0.2)

## 2021-03-17 LAB — RPR: RPR Ser Ql: NONREACTIVE

## 2021-03-17 LAB — TYPE AND SCREEN
ABO/RH(D): O POS
Antibody Screen: NEGATIVE

## 2021-03-17 LAB — SARS CORONAVIRUS 2 (TAT 6-24 HRS): SARS Coronavirus 2: NEGATIVE

## 2021-03-18 NOTE — Anesthesia Preprocedure Evaluation (Addendum)
Anesthesia Evaluation  Patient identified by MRN, date of birth, ID band Patient awake    Reviewed: Allergy & Precautions, NPO status , Patient's Chart, lab work & pertinent test results  History of Anesthesia Complications Negative for: history of anesthetic complications  Airway Mallampati: III   Neck ROM: Full    Dental  (+) Teeth Intact   Pulmonary neg pulmonary ROS,    Pulmonary exam normal        Cardiovascular hypertension (PIH), Normal cardiovascular exam     Neuro/Psych negative neurological ROS  negative psych ROS   GI/Hepatic negative GI ROS, Neg liver ROS,   Endo/Other  diabetes, Gestational, Oral Hypoglycemic AgentsMorbid obesity  Renal/GU negative Renal ROS     Musculoskeletal  Scoliosis    Abdominal   Peds  Hematology  (+) Sickle cell trait and anemia ,  Plt 237k    Anesthesia Other Findings Covid test negative   Reproductive/Obstetrics (+) Pregnancy                            Anesthesia Physical Anesthesia Plan  ASA: 3  Anesthesia Plan: Spinal   Post-op Pain Management:    Induction:   PONV Risk Score and Plan: 2 and Treatment may vary due to age or medical condition, Ondansetron and Scopolamine patch - Pre-op  Airway Management Planned: Natural Airway  Additional Equipment: None  Intra-op Plan:   Post-operative Plan:   Informed Consent: I have reviewed the patients History and Physical, chart, labs and discussed the procedure including the risks, benefits and alternatives for the proposed anesthesia with the patient or authorized representative who has indicated his/her understanding and acceptance.       Plan Discussed with: CRNA and Anesthesiologist  Anesthesia Plan Comments: (Labs reviewed, platelets acceptable. Discussed risks and benefits of spinal, including spinal/epidural hematoma, infection, failed block, and PDPH. Patient expressed  understanding and wished to proceed. )       Anesthesia Quick Evaluation

## 2021-03-19 ENCOUNTER — Inpatient Hospital Stay (HOSPITAL_COMMUNITY): Payer: BC Managed Care – PPO | Admitting: Anesthesiology

## 2021-03-19 ENCOUNTER — Encounter (HOSPITAL_COMMUNITY): Payer: Self-pay | Admitting: Obstetrics and Gynecology

## 2021-03-19 ENCOUNTER — Other Ambulatory Visit: Payer: Self-pay

## 2021-03-19 ENCOUNTER — Inpatient Hospital Stay (HOSPITAL_COMMUNITY)
Admission: RE | Admit: 2021-03-19 | Discharge: 2021-03-21 | DRG: 784 | Disposition: A | Discharge status: Home / Self Care | Payer: BC Managed Care – PPO | Attending: Obstetrics and Gynecology | Admitting: Obstetrics and Gynecology

## 2021-03-19 ENCOUNTER — Encounter (HOSPITAL_COMMUNITY)
Admission: RE | Disposition: A | Discharge status: Home / Self Care | Payer: Self-pay | Source: Home / Self Care | Attending: Obstetrics and Gynecology

## 2021-03-19 DIAGNOSIS — O1002 Pre-existing essential hypertension complicating childbirth: Secondary | ICD-10-CM | POA: Diagnosis present

## 2021-03-19 DIAGNOSIS — O34211 Maternal care for low transverse scar from previous cesarean delivery: Secondary | ICD-10-CM | POA: Diagnosis present

## 2021-03-19 DIAGNOSIS — Z3A39 39 weeks gestation of pregnancy: Secondary | ICD-10-CM

## 2021-03-19 DIAGNOSIS — O24425 Gestational diabetes mellitus in childbirth, controlled by oral hypoglycemic drugs: Secondary | ICD-10-CM | POA: Diagnosis present

## 2021-03-19 DIAGNOSIS — Z20822 Contact with and (suspected) exposure to covid-19: Secondary | ICD-10-CM | POA: Diagnosis present

## 2021-03-19 DIAGNOSIS — O34219 Maternal care for unspecified type scar from previous cesarean delivery: Secondary | ICD-10-CM | POA: Diagnosis present

## 2021-03-19 DIAGNOSIS — O99214 Obesity complicating childbirth: Secondary | ICD-10-CM | POA: Diagnosis present

## 2021-03-19 DIAGNOSIS — D573 Sickle-cell trait: Secondary | ICD-10-CM | POA: Diagnosis present

## 2021-03-19 DIAGNOSIS — Z302 Encounter for sterilization: Secondary | ICD-10-CM

## 2021-03-19 DIAGNOSIS — O9902 Anemia complicating childbirth: Secondary | ICD-10-CM | POA: Diagnosis present

## 2021-03-19 LAB — GLUCOSE, CAPILLARY
Glucose-Capillary: 81 mg/dL (ref 70–99)
Glucose-Capillary: 97 mg/dL (ref 70–99)

## 2021-03-19 SURGERY — Surgical Case
Anesthesia: Spinal | Site: Abdomen | Wound class: Clean Contaminated

## 2021-03-19 MED ORDER — CEFAZOLIN IN SODIUM CHLORIDE 3-0.9 GM/100ML-% IV SOLN
INTRAVENOUS | Status: AC
  Filled 2021-03-19: qty 100

## 2021-03-19 MED ORDER — MENTHOL 3 MG MT LOZG: 1.0000 | LOZENGE | OROMUCOSAL | Status: DC | PRN

## 2021-03-19 MED ORDER — MORPHINE SULFATE (PF) 0.5 MG/ML IJ SOLN
INTRAMUSCULAR | Status: AC
  Filled 2021-03-19: qty 10

## 2021-03-19 MED ORDER — CEFAZOLIN SODIUM-DEXTROSE 2-4 GM/100ML-% IV SOLN
INTRAVENOUS | Status: AC
  Filled 2021-03-19: qty 100

## 2021-03-19 MED ORDER — MEPERIDINE HCL 25 MG/ML IJ SOLN
6.2500 mg | INTRAMUSCULAR | Status: AC | PRN
  Administered 2021-03-19 (×2): 12.5 mg via INTRAVENOUS

## 2021-03-19 MED ORDER — ONDANSETRON HCL 4 MG/2ML IJ SOLN: 4.0000 mg | Freq: Three times a day (TID) | INTRAMUSCULAR | Status: DC | PRN

## 2021-03-19 MED ORDER — CEFAZOLIN SODIUM-DEXTROSE 2-3 GM-%(50ML) IV SOLR
INTRAVENOUS | Status: DC | PRN
  Administered 2021-03-19: 2 g via INTRAVENOUS

## 2021-03-19 MED ORDER — NALBUPHINE HCL 10 MG/ML IJ SOLN: 5.0000 mg | INTRAMUSCULAR | Status: DC | PRN

## 2021-03-19 MED ORDER — SOD CITRATE-CITRIC ACID 500-334 MG/5ML PO SOLN: 30.0000 mL | ORAL | Status: DC

## 2021-03-19 MED ORDER — OXYCODONE HCL 5 MG PO TABS: 5.0000 mg | ORAL_TABLET | Freq: Once | ORAL | Status: DC | PRN

## 2021-03-19 MED ORDER — DIBUCAINE (PERIANAL) 1 % EX OINT: 1.0000 "application " | TOPICAL_OINTMENT | CUTANEOUS | Status: DC | PRN

## 2021-03-19 MED ORDER — LACTATED RINGERS IV SOLN: INTRAVENOUS | Status: DC

## 2021-03-19 MED ORDER — ZOLPIDEM TARTRATE 5 MG PO TABS: 5.0000 mg | ORAL_TABLET | Freq: Every evening | ORAL | Status: DC | PRN

## 2021-03-19 MED ORDER — OXYCODONE HCL 5 MG/5ML PO SOLN: 5.0000 mg | Freq: Once | ORAL | Status: DC | PRN

## 2021-03-19 MED ORDER — CEFAZOLIN SODIUM-DEXTROSE 2-4 GM/100ML-% IV SOLN: 2.0000 g | INTRAVENOUS | Status: DC

## 2021-03-19 MED ORDER — NALOXONE HCL 4 MG/10ML IJ SOLN
1.0000 ug/kg/h | INTRAVENOUS | Status: DC | PRN
  Filled 2021-03-19: qty 5

## 2021-03-19 MED ORDER — MORPHINE SULFATE (PF) 0.5 MG/ML IJ SOLN
INTRAMUSCULAR | Status: DC | PRN
  Administered 2021-03-19: .15 mg via INTRATHECAL

## 2021-03-19 MED ORDER — DIPHENHYDRAMINE HCL 50 MG/ML IJ SOLN: 12.5000 mg | INTRAMUSCULAR | Status: DC | PRN

## 2021-03-19 MED ORDER — SODIUM CHLORIDE 0.9 % IR SOLN
Status: DC | PRN
  Administered 2021-03-19: 1000 mL

## 2021-03-19 MED ORDER — DEXAMETHASONE SODIUM PHOSPHATE 4 MG/ML IJ SOLN
INTRAMUSCULAR | Status: DC | PRN
  Administered 2021-03-19: 5 mg via INTRAVENOUS

## 2021-03-19 MED ORDER — COCONUT OIL OIL
1.0000 | TOPICAL_OIL | Status: DC | PRN
Start: 2021-03-19 — End: 2021-03-21
  Administered 2021-03-20: 1 via TOPICAL

## 2021-03-19 MED ORDER — SODIUM CHLORIDE 0.9% FLUSH: 3.0000 mL | INTRAVENOUS | Status: DC | PRN

## 2021-03-19 MED ORDER — DIPHENHYDRAMINE HCL 25 MG PO CAPS: 25.0000 mg | ORAL_CAPSULE | Freq: Four times a day (QID) | ORAL | Status: DC | PRN

## 2021-03-19 MED ORDER — SOD CITRATE-CITRIC ACID 500-334 MG/5ML PO SOLN
ORAL | Status: AC
  Filled 2021-03-19: qty 30

## 2021-03-19 MED ORDER — SCOPOLAMINE 1 MG/3DAYS TD PT72: 1.0000 | MEDICATED_PATCH | Freq: Once | TRANSDERMAL | Status: DC

## 2021-03-19 MED ORDER — BUPIVACAINE IN DEXTROSE 0.75-8.25 % IT SOLN
INTRATHECAL | Status: DC | PRN
  Administered 2021-03-19: 1.6 mL via INTRATHECAL

## 2021-03-19 MED ORDER — SIMETHICONE 80 MG PO CHEW
80.0000 mg | CHEWABLE_TABLET | ORAL | Status: DC | PRN
Start: 2021-03-19 — End: 2021-03-21

## 2021-03-19 MED ORDER — OXYTOCIN-SODIUM CHLORIDE 30-0.9 UT/500ML-% IV SOLN
INTRAVENOUS | Status: DC | PRN
  Administered 2021-03-19 (×2): 150 mL via INTRAVENOUS

## 2021-03-19 MED ORDER — ACETAMINOPHEN 10 MG/ML IV SOLN
INTRAVENOUS | Status: DC | PRN
  Administered 2021-03-19: 1000 mg via INTRAVENOUS

## 2021-03-19 MED ORDER — PHENYLEPHRINE HCL-NACL 20-0.9 MG/250ML-% IV SOLN
INTRAVENOUS | Status: AC
  Filled 2021-03-19: qty 250

## 2021-03-19 MED ORDER — FENTANYL CITRATE (PF) 100 MCG/2ML IJ SOLN
INTRAMUSCULAR | Status: DC | PRN
  Administered 2021-03-19: 15 ug via INTRATHECAL

## 2021-03-19 MED ORDER — OXYCODONE HCL 5 MG PO TABS
5.0000 mg | ORAL_TABLET | ORAL | Status: DC | PRN
  Administered 2021-03-20 – 2021-03-21 (×4): 5 mg via ORAL
  Filled 2021-03-19 (×4): qty 1

## 2021-03-19 MED ORDER — BUPIVACAINE HCL (PF) 0.25 % IJ SOLN
INTRAMUSCULAR | Status: DC | PRN
  Administered 2021-03-19: 10 mL

## 2021-03-19 MED ORDER — SENNOSIDES-DOCUSATE SODIUM 8.6-50 MG PO TABS
2.0000 | ORAL_TABLET | ORAL | Status: DC
  Administered 2021-03-19 – 2021-03-21 (×3): 2 via ORAL
  Filled 2021-03-19 (×3): qty 2

## 2021-03-19 MED ORDER — NALBUPHINE HCL 10 MG/ML IJ SOLN
5.0000 mg | INTRAMUSCULAR | Status: DC | PRN
  Filled 2021-03-19: qty 1

## 2021-03-19 MED ORDER — SCOPOLAMINE 1 MG/3DAYS TD PT72
1.0000 | MEDICATED_PATCH | Freq: Once | TRANSDERMAL | Status: DC
  Administered 2021-03-19: 1.5 mg via TRANSDERMAL

## 2021-03-19 MED ORDER — OXYTOCIN-SODIUM CHLORIDE 30-0.9 UT/500ML-% IV SOLN
2.5000 [IU]/h | INTRAVENOUS | Status: AC
  Filled 2021-03-19 (×2): qty 500

## 2021-03-19 MED ORDER — POVIDONE-IODINE 10 % EX SWAB
2.0000 "application " | Freq: Once | CUTANEOUS | Status: AC
  Administered 2021-03-19: 2 via TOPICAL

## 2021-03-19 MED ORDER — KETOROLAC TROMETHAMINE 30 MG/ML IJ SOLN
INTRAMUSCULAR | Status: AC
  Filled 2021-03-19: qty 1

## 2021-03-19 MED ORDER — FAMOTIDINE 20 MG PO TABS
ORAL_TABLET | ORAL | Status: AC
  Filled 2021-03-19: qty 1

## 2021-03-19 MED ORDER — BUPIVACAINE HCL (PF) 0.25 % IJ SOLN
INTRAMUSCULAR | Status: AC
  Filled 2021-03-19: qty 20

## 2021-03-19 MED ORDER — NALOXONE HCL 0.4 MG/ML IJ SOLN: 0.4000 mg | INTRAMUSCULAR | Status: DC | PRN

## 2021-03-19 MED ORDER — DIPHENHYDRAMINE HCL 25 MG PO CAPS: 25.0000 mg | ORAL_CAPSULE | ORAL | Status: DC | PRN

## 2021-03-19 MED ORDER — PROMETHAZINE HCL 25 MG/ML IJ SOLN: 6.2500 mg | INTRAMUSCULAR | Status: DC | PRN

## 2021-03-19 MED ORDER — ORAL CARE MOUTH RINSE: 15.0000 mL | Freq: Once | OROMUCOSAL | Status: AC

## 2021-03-19 MED ORDER — STERILE WATER FOR IRRIGATION IR SOLN
Status: DC | PRN
  Administered 2021-03-19: 1000 mL

## 2021-03-19 MED ORDER — CHLORHEXIDINE GLUCONATE 0.12 % MT SOLN
OROMUCOSAL | Status: AC
  Filled 2021-03-19: qty 15

## 2021-03-19 MED ORDER — SIMETHICONE 80 MG PO CHEW
80.0000 mg | CHEWABLE_TABLET | Freq: Three times a day (TID) | ORAL | Status: DC
  Administered 2021-03-19 – 2021-03-21 (×7): 80 mg via ORAL
  Filled 2021-03-19 (×7): qty 1

## 2021-03-19 MED ORDER — FENTANYL CITRATE (PF) 100 MCG/2ML IJ SOLN: 25.0000 ug | INTRAMUSCULAR | Status: DC | PRN

## 2021-03-19 MED ORDER — PHENYLEPHRINE HCL-NACL 20-0.9 MG/250ML-% IV SOLN
INTRAVENOUS | Status: DC | PRN
  Administered 2021-03-19: 40 ug/min via INTRAVENOUS

## 2021-03-19 MED ORDER — ACETAMINOPHEN 500 MG PO TABS
1000.0000 mg | ORAL_TABLET | Freq: Four times a day (QID) | ORAL | Status: DC
  Administered 2021-03-19 – 2021-03-21 (×8): 1000 mg via ORAL
  Filled 2021-03-19 (×8): qty 2

## 2021-03-19 MED ORDER — FENTANYL CITRATE (PF) 100 MCG/2ML IJ SOLN
INTRAMUSCULAR | Status: AC
  Filled 2021-03-19: qty 2

## 2021-03-19 MED ORDER — ONDANSETRON HCL 4 MG/2ML IJ SOLN
INTRAMUSCULAR | Status: AC
  Filled 2021-03-19: qty 2

## 2021-03-19 MED ORDER — MEPERIDINE HCL 25 MG/ML IJ SOLN
INTRAMUSCULAR | Status: AC
  Filled 2021-03-19: qty 1

## 2021-03-19 MED ORDER — SCOPOLAMINE 1 MG/3DAYS TD PT72
MEDICATED_PATCH | TRANSDERMAL | Status: AC
  Filled 2021-03-19: qty 1

## 2021-03-19 MED ORDER — WITCH HAZEL-GLYCERIN EX PADS: 1.0000 "application " | MEDICATED_PAD | CUTANEOUS | Status: DC | PRN

## 2021-03-19 MED ORDER — CHLORHEXIDINE GLUCONATE 0.12 % MT SOLN
15.0000 mL | Freq: Once | OROMUCOSAL | Status: AC
  Administered 2021-03-19: 15 mL via OROMUCOSAL

## 2021-03-19 MED ORDER — PRENATAL MULTIVITAMIN CH
1.0000 | ORAL_TABLET | Freq: Every day | ORAL | Status: DC
  Administered 2021-03-20 – 2021-03-21 (×2): 1 via ORAL
  Filled 2021-03-19 (×2): qty 1

## 2021-03-19 MED ORDER — ONDANSETRON HCL 4 MG/2ML IJ SOLN
INTRAMUSCULAR | Status: DC | PRN
  Administered 2021-03-19: 4 mg via INTRAVENOUS

## 2021-03-19 MED ORDER — FAMOTIDINE 20 MG PO TABS
20.0000 mg | ORAL_TABLET | Freq: Once | ORAL | Status: AC
Start: 2021-03-19 — End: 2021-03-19
  Administered 2021-03-19: 20 mg via ORAL

## 2021-03-19 MED ORDER — NALBUPHINE HCL 10 MG/ML IJ SOLN: 5.0000 mg | Freq: Once | INTRAMUSCULAR | Status: AC | PRN

## 2021-03-19 MED ORDER — KETOROLAC TROMETHAMINE 30 MG/ML IJ SOLN: 30.0000 mg | Freq: Four times a day (QID) | INTRAMUSCULAR | Status: AC | PRN

## 2021-03-19 MED ORDER — KETOROLAC TROMETHAMINE 30 MG/ML IJ SOLN
30.0000 mg | Freq: Four times a day (QID) | INTRAMUSCULAR | Status: AC | PRN
  Administered 2021-03-19 – 2021-03-20 (×4): 30 mg via INTRAVENOUS
  Filled 2021-03-19 (×3): qty 1

## 2021-03-19 MED ORDER — SOD CITRATE-CITRIC ACID 500-334 MG/5ML PO SOLN
30.0000 mL | Freq: Once | ORAL | Status: AC
  Administered 2021-03-19: 30 mL via ORAL

## 2021-03-19 MED ORDER — NALBUPHINE HCL 10 MG/ML IJ SOLN
5.0000 mg | Freq: Once | INTRAMUSCULAR | Status: AC | PRN
Start: 2021-03-19 — End: 2021-03-19
  Administered 2021-03-19: 5 mg via INTRAVENOUS

## 2021-03-19 MED ORDER — DEXAMETHASONE SODIUM PHOSPHATE 4 MG/ML IJ SOLN
INTRAMUSCULAR | Status: AC
  Filled 2021-03-19: qty 1

## 2021-03-19 SURGICAL SUPPLY — 46 items
APL SKNCLS STERI-STRIP NONHPOA (GAUZE/BANDAGES/DRESSINGS) ×2
BARRIER ADHS 3X4 INTERCEED (GAUZE/BANDAGES/DRESSINGS) ×2 IMPLANT
BENZOIN TINCTURE PRP APPL 2/3 (GAUZE/BANDAGES/DRESSINGS) ×4 IMPLANT
BRR ADH 4X3 ABS CNTRL BYND (GAUZE/BANDAGES/DRESSINGS) ×1
CLAMP CORD UMBIL (MISCELLANEOUS) IMPLANT
CLOSURE STERI STRIP 1/2 X4 (GAUZE/BANDAGES/DRESSINGS) ×2 IMPLANT
CLOTH BEACON ORANGE TIMEOUT ST (SAFETY) ×2 IMPLANT
DRAPE C SECTION CLR SCREEN (DRAPES) ×2 IMPLANT
DRSG OPSITE POSTOP 4X10 (GAUZE/BANDAGES/DRESSINGS) ×2 IMPLANT
ELECT REM PT RETURN 9FT ADLT (ELECTROSURGICAL) ×2
ELECTRODE REM PT RTRN 9FT ADLT (ELECTROSURGICAL) ×1 IMPLANT
EXTRACTOR VACUUM BELL CUP MITY (SUCTIONS) ×2 IMPLANT
EXTRACTOR VACUUM M CUP 4 TUBE (SUCTIONS) IMPLANT
GLOVE BIOGEL PI IND STRL 7.0 (GLOVE) ×2 IMPLANT
GLOVE BIOGEL PI INDICATOR 7.0 (GLOVE) ×2
GLOVE ECLIPSE 6.5 STRL STRAW (GLOVE) ×2 IMPLANT
GOWN STRL REUS W/TWL LRG LVL3 (GOWN DISPOSABLE) ×4 IMPLANT
KIT ABG SYR 3ML LUER SLIP (SYRINGE) IMPLANT
NEEDLE HYPO 22GX1.5 SAFETY (NEEDLE) ×2 IMPLANT
NEEDLE HYPO 25X5/8 SAFETYGLIDE (NEEDLE) IMPLANT
NS IRRIG 1000ML POUR BTL (IV SOLUTION) ×2 IMPLANT
PACK C SECTION WH (CUSTOM PROCEDURE TRAY) ×2 IMPLANT
PAD OB MATERNITY 4.3X12.25 (PERSONAL CARE ITEMS) ×2 IMPLANT
RTRCTR C-SECT PINK 25CM LRG (MISCELLANEOUS) IMPLANT
SPONGE LAP 18X18 RF (DISPOSABLE) ×2 IMPLANT
SPONGE LAP 18X18 X RAY DECT (DISPOSABLE) ×2 IMPLANT
STRIP CLOSURE SKIN 1/2X4 (GAUZE/BANDAGES/DRESSINGS) ×2 IMPLANT
SUT CHROMIC GUT AB #0 18 (SUTURE) IMPLANT
SUT MNCRL 0 VIOLET CTX 36 (SUTURE) ×3 IMPLANT
SUT MON AB 2-0 SH 27 (SUTURE)
SUT MON AB 2-0 SH27 (SUTURE) IMPLANT
SUT MON AB 3-0 SH 27 (SUTURE)
SUT MON AB 3-0 SH27 (SUTURE) IMPLANT
SUT MON AB 4-0 PS1 27 (SUTURE) IMPLANT
SUT MONOCRYL 0 CTX 36 (SUTURE) ×6
SUT PLAIN 2 0 (SUTURE)
SUT PLAIN 2 0 XLH (SUTURE) IMPLANT
SUT PLAIN ABS 2-0 CT1 27XMFL (SUTURE) IMPLANT
SUT VIC AB 0 CT1 36 (SUTURE) ×4 IMPLANT
SUT VIC AB 2-0 CT1 27 (SUTURE) ×2
SUT VIC AB 2-0 CT1 TAPERPNT 27 (SUTURE) ×1 IMPLANT
SUT VIC AB 4-0 PS2 27 (SUTURE) IMPLANT
SYR CONTROL 10ML LL (SYRINGE) ×2 IMPLANT
TOWEL OR 17X24 6PK STRL BLUE (TOWEL DISPOSABLE) ×2 IMPLANT
TRAY FOLEY W/BAG SLVR 14FR LF (SET/KITS/TRAYS/PACK) IMPLANT
WATER STERILE IRR 1000ML POUR (IV SOLUTION) ×2 IMPLANT

## 2021-03-19 NOTE — Anesthesia Procedure Notes (Signed)
Spinal  Patient location during procedure: OR Start time: 03/19/2021 7:38 AM End time: 03/19/2021 7:42 AM Reason for block: surgical anesthesia Staffing Performed: anesthesiologist  Anesthesiologist: Beryle Lathe, MD Preanesthetic Checklist Completed: patient identified, IV checked, risks and benefits discussed, surgical consent, monitors and equipment checked, pre-op evaluation and timeout performed Spinal Block Patient position: sitting Prep: DuraPrep Patient monitoring: heart rate, cardiac monitor, continuous pulse ox and blood pressure Approach: midline Location: L3-4 Injection technique: single-shot Needle Needle type: Pencan  Needle gauge: 24 G Additional Notes Consent was obtained prior to the procedure with all questions answered and concerns addressed. Risks including, but not limited to, bleeding, infection, nerve damage, paralysis, failed block, inadequate analgesia, allergic reaction, high spinal, itching, and headache were discussed and the patient wished to proceed. Functioning IV was confirmed and monitors were applied. Sterile prep and drape, including hand hygiene, mask, and sterile gloves were used. The patient was positioned and the spine was prepped. The skin was anesthetized with lidocaine. Free flow of clear CSF was obtained prior to injecting local anesthetic into the CSF. The spinal needle aspirated freely following injection. The needle was carefully withdrawn. The patient tolerated the procedure well.   Leslye Peer, MD

## 2021-03-19 NOTE — Brief Op Note (Signed)
03/19/2021  9:05 AM  PATIENT:  Ellen Rowe  40 y.o. female  PRE-OPERATIVE DIAGNOSIS:  Previous Cesarean section, desires sterilization, Class A2 GDM, term gestation  POST-OPERATIVE DIAGNOSIS: Previous Cesarean section, funic presentation, desires sterilization, Class A2 GDM, term gestation   PROCEDURE:  Repeat cesarean section, kerr hysterotomy Modified Pomeroy tubal ligation  SURGEON:  Surgeon(s) and Role:    * Blayre Papania, Nena Jordan, MD - Primary  PHYSICIAN ASSISTANT:   ASSISTANTS: none   ANESTHESIA:   spinal FINDINGS;  floating vtx with loops  cords presenting, nl tubes and ovaries. Live female EBL:  464 ml   BLOOD ADMINISTERED:none  DRAINS: none   LOCAL MEDICATIONS USED:  MARCAINE     SPECIMEN:  Source of Specimen:  portion of right and left fallopian tubes  DISPOSITION OF SPECIMEN:  PATHOLOGY  COUNTS:  YES  TOURNIQUET:  * No tourniquets in log *  DICTATION: .Other Dictation: Dictation Number [id number]  PLAN OF CARE: Admit to inpatient   PATIENT DISPOSITION:  PACU - hemodynamically stable.   Delay start of Pharmacological VTE agent (>24hrs) due to surgical blood loss or risk of bleeding: no

## 2021-03-19 NOTE — Transfer of Care (Signed)
Immediate Anesthesia Transfer of Care Note  Patient: Deedra Broecker  Procedure(s) Performed: CESAREAN SECTION WITH BILATERAL TUBAL LIGATION (Abdomen)  Patient Location: PACU  Anesthesia Type:Spinal  Level of Consciousness: awake, alert  and oriented  Airway & Oxygen Therapy: Patient Spontanous Breathing  Post-op Assessment: Report given to RN and Post -op Vital signs reviewed and stable  Post vital signs: Reviewed and stable  Last Vitals:  Vitals Value Taken Time  BP 133/81 03/19/21 0908  Temp    Pulse 86 03/19/21 0911  Resp 0 03/19/21 0911  SpO2 96 % 03/19/21 0911  Vitals shown include unvalidated device data.  Last Pain:  Vitals:   03/19/21 0550  TempSrc: Oral  PainSc:       Patients Stated Pain Goal: 0 (03/19/21 0550)  Complications: No notable events documented.

## 2021-03-19 NOTE — H&P (Signed)
Ellen Rowe is a 40 y.o. female presenting @ 39 wks for repeat C/S, TL due to previous C/S, desires sterilization. PNC complicated by Class A2 GDM, AMA, chronic HTN no med. . OB History     Gravida  4   Para  2   Term  2   Preterm      AB  1   Living  2      SAB  1   IAB      Ectopic      Multiple  0   Live Births  2          Past Medical History:  Diagnosis Date   Anemia    Gestational diabetes    Pregnancy induced hypertension    Scoliosis    Sickle cell trait (HCC)    Past Surgical History:  Procedure Laterality Date   CESAREAN SECTION N/A 02/07/2015   Procedure: CESAREAN SECTION;  Surgeon: Maxie Better, MD;  Location: [facility];  Service: Obstetrics;  Laterality: N/A;   WISDOM TOOTH EXTRACTION  2000   Family History: family history includes Diabetes in her father and mother; Heart disease in her father; Hypertension in her father and mother. Social History:  reports that she has never smoked. She has never used smokeless tobacco. She reports that she does not drink alcohol and does not use drugs.     Maternal Diabetes: Yes:  Diabetes Type:  Insulin/Medication controlled Genetic Screening: Normal Maternal Ultrasounds/Referrals: Normal Fetal Ultrasounds or other Referrals:  Fetal echo nl Maternal Substance Abuse:  No Significant Maternal Medications:  Meds include: Other:  Significant Maternal Lab Results:  Group B Strep negative Other Comments:   AMA, Class A2 GDM. Chronic HTN no med SS trait( FOB neg)  Review of Systems  All other systems reviewed and are negative. Maternal Medical History:  Fetal activity: Perceived fetal activity is normal.   Prenatal Complications - Diabetes: gestational. Diabetes is managed by oral agent (monotherapy).      Last menstrual period 06/19/2020, unknown if currently breastfeeding. Maternal Exam:  Introitus: Normal vulva.  Physical Exam Constitutional:      Appearance: Normal appearance.  Eyes:      Extraocular Movements: Extraocular movements intact.  Cardiovascular:     Rate and Rhythm: Normal rate and regular rhythm.     Heart sounds: Normal heart sounds.  Pulmonary:     Breath sounds: Normal breath sounds.  Genitourinary:    General: Normal vulva.     Comments: Cervix: long/closed/OOP Uterus gravid Musculoskeletal:        General: Swelling present.     Cervical back: Neck supple.  Skin:    General: Skin is warm and dry.  Neurological:     Mental Status: She is alert and oriented to person, place, and time.  Psychiatric:        Mood and Affect: Mood normal.        Behavior: Behavior normal.    Prenatal labs: ABO, Rh: --/--/O POS (06/21 0912) Antibody: NEG (06/21 0912) Rubella:  Immune RPR: NON REACTIVE (06/21 0905)  HBsAg:   negative HIV:   negative GBS:   negative  Assessment/Plan: Previous Cesarean section Desires sterilization Term gestation Class A2 GDM Chronic HTN no med  P) Repeat C/S, TL. Risk of surgery reviewed  including infection, bleeding, injury to bladder, bowel, ureter, possible need for blood transfusion and its risk( HIV, acute rxn, hepatitis), internal scar tissue, permanent sterilization, non reversible, failure rate 1/300. All ? answered  Ellen Rowe 03/19/2021, 4:40 AM

## 2021-03-19 NOTE — Lactation Note (Signed)
This note was copied from a baby's chart. Lactation Consultation Note  Patient Name: Ellen Rowe Today's Date: 03/19/2021 Reason for consult: Mother's request;Difficult latch;Initial assessment;Term;Maternal endocrine disorder (PIH) Age:41 hours  Infant has labial attachment. With suck training, able to extend her tongue to base of my finger. Infant has high palate and tends to do a lot of tongue sucking. LC assisted Mom getting a deeper latch with breast compression.   Mom provided with manual pump sized with 24 flange as comfortable fit.  Mom to pre pump 5-10 minutes before latching.   Plan 1. To feed based on cues 8-12x in 24 hr period no more than 4 hrs without an attempt. Mom to ensure lips are flanged out at the breast and look for signs of milk transfer.           2. Mom to offer EBM via spoon if unable to get infant to latch.           3. I and O sheet reviewed.           4. LC brochure of inpatient and outpatient services reviewed.  All questions answered at the end of the visit.   Maternal Data Has patient been taught Hand Expression?: Yes Does the patient have breastfeeding experience prior to this delivery?: Yes How long did the patient breastfeed?: 6 months for one, 18 months for other  Feeding Mother's Current Feeding Choice: Breast Milk  LATCH Score Latch: Repeated attempts needed to sustain latch, nipple held in mouth throughout feeding, stimulation needed to elicit sucking reflex.  Audible Swallowing: Spontaneous and intermittent  Type of Nipple: Flat (appears erect but is compressible when attempting to latch)  Comfort (Breast/Nipple): Soft / non-tender  Hold (Positioning): Assistance needed to correctly position infant at breast and maintain latch.  LATCH Score: 7   Lactation Tools Discussed/Used Tools: Flanges;Pump Flange Size: 24 Breast pump type: Manual Pump Education: Setup, frequency, and cleaning;Milk Storage Reason for Pumping: increase  stimulation Pumping frequency: pre pump 5-10 min before latching.  Interventions Interventions: Breast feeding basics reviewed;Breast compression;Assisted with latch;Adjust position;Hand pump;Skin to skin;Support pillows;Breast massage;Position options;Hand express;Expressed milk;Education;Pre-pump if needed  Discharge Pump: Personal WIC Program: No  Consult Status Consult Status: Follow-up Date: 03/20/21 Follow-up type: In-patient    Ellen  Rowe 03/19/2021, 9:56 PM

## 2021-03-19 NOTE — Op Note (Signed)
Ellen, Rowe MEDICAL RECORD NO: [id number] ACCOUNT NO: [id number] DATE OF BIRTH: 1981-03-22 FACILITY: [facility] LOCATION: [facility] PHYSICIAN: Zacaria Pousson A. Cherly Hensen, MD  Operative Report   DATE OF PROCEDURE: 03/19/2021  PREOPERATIVE DIAGNOSES:  Previous cesarean section, desired sterilization, class A2 gestational diabetes, term gestation.  PROCEDURE:  Repeat cesarean section, Sharl Ma hysterotomy, modified Pomeroy tubal ligation.  POSTOPERATIVE DIAGNOSES:  Previous cesarean section, Funic presentation, desires sterilization, class A2 gestational diabetes, term gestation.  ANESTHESIA:  Spinal.  SURGEON:  Maxie Better, MD  ASSISTANT:  None.  DESCRIPTION OF PROCEDURE:  Under adequate spinal anesthesia, the patient was placed in the supine position with a left lateral tilt and A traxi retractor was placed due to the large pannus.  The patient was then sterilely prepped and draped in the usual  fashion, indwelling Foley catheter was sterilely placed.  0.25% Marcaine was injected along the previous Pfannenstiel skin incision site.  Pfannenstiel skin incision was then made, carried down to the rectus fascia.  Rectus fascia opened transversely.   The rectus fascia was then bluntly and sharply dissected off the rectus muscle in a superior and inferior fashion and in doing so, the rectus fascia was then split in the midline.  The parietal peritoneum was entered bluntly and extended and utilized to  further dissect the rectus fascia off the rectus muscle at the superior fascia.  The parietal peritoneum was extended superiorly and inferiorly.  An Alexis self-retaining retractor was then placed.  Vesicouterine peritoneum which had adhesions was opened transversely and the bladder sharply dissected off the lower uterine segment and displaced inferiorly.  A curvilinear low transverse incision was then made and extended in a cephalic and caudad fashion.  A clear amniotic sac then protruded.  Artificial  rupture of membranes was then accomplished.  A large loop of cord was encountered in the field.  At that point, attempt to reduce the cord was done.  The vertex was a floating vertex.  The head was not amenable to  being delivered without assistance and therefore, the vacuum was placed, simultaneously trying to still displace the cord back up into the uterus. With a popoff x1, the vacuum was applied, stabilizing the head further and allowing for the cord to be  continued to be pushed, we put back into the uterine cavity, with subsequent delivery of a live female from the right occiput transverse position with a loop of cord that apparently was also around the neck and was able to be reduced.  The baby had  briefly clamping of the cord, cut and transferred to the awaiting pediatricians who assigned Apgars of 7 and 9 at 1 and 5 minutes.  The placenta was anterior and spontaneously removed.  Uterine cavity was cleaned of debris.  Uterine incision had no  extension.  Incision was closed in two layers, the first layer was 0 Monocryl in a running locked stitch, second layer was imbricated using 0 Monocryl suture.  A figure-of-eight suture was placed in the middle of the incision for bleeding.  Attention was  then turned to the fallopian tubes and ovaries bilaterally, which were both normal bilaterally.  Starting on the right, the midportion of the fallopian tube was grasped with a Babcock.  The underlying mesosalpinx was opened with cautery.  The proximal  and distal portion of the tube was tied with 0 chromic suture x2 proximally and distally and the intervening segment of right fallopian tube was removed.  The same procedure was performed on the contralateral side after  identifying the fimbriated end of  the tube.  The midportion of that tube was also removed on the left.  The attention was then turned back to the incision.  Small bleeders were cauterized.  Interceed was placed in inverted fashion overlying the  incision.  The Alexis retractor was then  removed.  The parietal peritoneum was then closed with 2-0 Vicryl.  The rectus fascia was closed with 0 Vicryl x2.  The subcutaneous area was irrigated, small bleeders cauterized.  Interrupted 2-0 plain suture was placed and the skin approximated with 4-0 Vicryl subcuticular closure.  Steri-Strips and benzoin was placed.    SPECIMEN:  Portion of right and left fallopian tubes sent to pathology.  Placenta was not sent.  ESTIMATED BLOOD LOSS: 464 mL.  INTRAOPERATIVE FLUIDS:  2 liters.  URINE OUTPUT:  100 mL clear yellow urine.  Sponge and instrument counts x2 was correct.  Complication was none.  The patient tolerated the procedure well and was transferred to recovery room in stable condition.   SHW D: 03/19/2021 10:00:53 pm T: 03/19/2021 11:46:00 pm  JOB: [id number]/ [id number]

## 2021-03-19 NOTE — Anesthesia Postprocedure Evaluation (Signed)
Anesthesia Post Note  Patient: Sport and exercise psychologist  Procedure(s) Performed: CESAREAN SECTION WITH BILATERAL TUBAL LIGATION (Abdomen)     Patient location during evaluation: PACU Anesthesia Type: Spinal Level of consciousness: awake and alert Pain management: pain level controlled Vital Signs Assessment: post-procedure vital signs reviewed and stable Respiratory status: spontaneous breathing and respiratory function stable Cardiovascular status: blood pressure returned to baseline and stable Postop Assessment: spinal receding and no apparent nausea or vomiting Anesthetic complications: no   No notable events documented.  Last Vitals:  Vitals:   03/19/21 1010 03/19/21 1120  BP: 129/83 115/71  Pulse: 95 100  Resp: 20 17  Temp: 36.9 C 36.7 C  SpO2: 97%     Last Pain:  Vitals:   03/19/21 1120  TempSrc: Oral  PainSc: 0-No pain   Pain Goal: Patients Stated Pain Goal: 0 (03/19/21 0550)                 Beryle Lathe

## 2021-03-20 LAB — CBC
HCT: 27.5 % — ABNORMAL LOW (ref 36.0–46.0)
Hemoglobin: 9.5 g/dL — ABNORMAL LOW (ref 12.0–15.0)
MCH: 33.3 pg (ref 26.0–34.0)
MCHC: 34.5 g/dL (ref 30.0–36.0)
MCV: 96.5 fL (ref 80.0–100.0)
Platelets: 213 10*3/uL (ref 150–400)
RBC: 2.85 MIL/uL — ABNORMAL LOW (ref 3.87–5.11)
RDW: 14.2 % (ref 11.5–15.5)
WBC: 9.5 10*3/uL (ref 4.0–10.5)
nRBC: 0 % (ref 0.0–0.2)

## 2021-03-20 LAB — BIRTH TISSUE RECOVERY COLLECTION (PLACENTA DONATION)

## 2021-03-20 MED ORDER — HYDROCHLOROTHIAZIDE 12.5 MG PO CAPS
12.5000 mg | ORAL_CAPSULE | Freq: Every day | ORAL | Status: DC
  Administered 2021-03-20 – 2021-03-21 (×2): 12.5 mg via ORAL
  Filled 2021-03-20 (×2): qty 1

## 2021-03-20 NOTE — Progress Notes (Signed)
SVD: repeat  S:  Pt reports feeling well/ Tolerating po/ Voiding without problems/ No n/v/ Bleeding is light/ Pain controlled withprescription NSAID's including ibuprofen (Motrin) and narcotic analgesics including oxycodone/acetaminophen (Percocet, Tylox)    O:  A & O x 3 / VS: Blood pressure 132/79, pulse 99, temperature 98.3 F (36.8 C), temperature source Oral, resp. rate 18, height 5' (1.524 m), weight 108.6 kg, last menstrual period 06/19/2020, SpO2 97 %, unknown if currently breastfeeding.  LABS:  Results for orders placed or performed during the hospital encounter of 03/19/21 (from the past 24 hour(s))  CBC     Status: Abnormal   Collection Time: 03/20/21  4:25 AM  Result Value Ref Range   WBC 9.5 4.0 - 10.5 K/uL   RBC 2.85 (L) 3.87 - 5.11 MIL/uL   Hemoglobin 9.5 (L) 12.0 - 15.0 g/dL   HCT 64.4 (L) 03.4 - 74.2 %   MCV 96.5 80.0 - 100.0 fL   MCH 33.3 26.0 - 34.0 pg   MCHC 34.5 30.0 - 36.0 g/dL   RDW 59.5 63.8 - 75.6 %   Platelets 213 150 - 400 K/uL   nRBC 0.0 0.0 - 0.2 %  Collect bld for placenta donatation     Status: None   Collection Time: 03/20/21  4:25 AM  Result Value Ref Range   Placenta donation bld collect Collected by Laboratory     I&O: I/O last 3 completed shifts: In: 2700 [P.O.:400; I.V.:2300] Out: 2268 [Urine:1700; Blood:568]   Total I/O In: -  Out: 450 [Urine:450]  Lungs: chest clear, no wheezing, rales, normal symmetric air entry  Heart: regular rate and rhythm, S1, S2 normal, no murmur, click, rub or gallop  Abdomen: soft uterus @ umb firm surgical tender. Primary dressing intact, dried brown blood on right  Perineum: not inspected  Lochia: light  Extremities:no redness or tenderness in the calves or thighs, edema 2+    A/P: POD # 1/PPD # 4/ [id number] s/p repeat C/S, BTL Class A2 GDM  Doing well  Continue routine post partum orders  HCTZ for peripheral edema  Abdominal binder  D/c home in am if remains stable

## 2021-03-21 MED ORDER — OXYCODONE HCL 5 MG PO TABS: 5.0000 mg | ORAL_TABLET | ORAL | 0 refills | Status: DC | PRN

## 2021-03-21 MED ORDER — HYDROCHLOROTHIAZIDE 12.5 MG PO CAPS: 12.5000 mg | ORAL_CAPSULE | Freq: Every day | ORAL | 0 refills | Status: DC

## 2021-03-21 MED ORDER — IBUPROFEN 600 MG PO TABS: 600.0000 mg | ORAL_TABLET | Freq: Four times a day (QID) | ORAL | 11 refills | Status: DC | PRN

## 2021-03-21 NOTE — Lactation Note (Signed)
This note was copied from a baby's chart. Lactation Consultation Note  Patient Name: Girl Ahlia Lemanski Today's Date: 03/21/2021 Reason for consult: Follow-up assessment;Term Age:41 hours   P3 mother whose infant is now 47 hours old.  This is a term baby at 39+0 weeks.  Mother breast fed her first two children (now 76 and 21 years old).  RN requested assistance.  Mother's breasts are very full.  Baby has been breast feeding but does not seem content.  When I arrived, mother reported baby had just fed for 15 minutes and was asleep at the breast.  Noted mother's breasts to be very full.  Mother stated that her milk came in fast.  Breast massage completed and observed mother pumping for 15 minutes while performing breast massage.  Drops only obtained during pumping.  Baby still awake and showing cues.  Latched again on both breasts for a total of 15 minutes; few intermittent swallows noted.  Baby fell asleep and placed back in the bassinet.  Ice packs provided with instructions.  Mother noted slight relief.  RN updated and will help monitor.  Father present.  Mother has a DEBP for home use.   Maternal Data Has patient been taught Hand Expression?: Yes Does the patient have breastfeeding experience prior to this delivery?: Yes How long did the patient breastfeed?: 6 months with first child and 18 months with second child  Feeding Mother's Current Feeding Choice: Breast Milk  LATCH Score Latch: Repeated attempts needed to sustain latch, nipple held in mouth throughout feeding, stimulation needed to elicit sucking reflex.  Audible Swallowing: A few with stimulation  Type of Nipple: Everted at rest and after stimulation  Comfort (Breast/Nipple): Soft / non-tender  Hold (Positioning): Assistance needed to correctly position infant at breast and maintain latch.  LATCH Score: 7   Lactation Tools Discussed/Used Tools: Pump;Flanges;Coconut oil Flange Size: 27 Breast pump type:  Double-Electric Breast Pump;Manual Pump Education: Setup, frequency, and cleaning Reason for Pumping: Supplementation; breasts very full Pumping frequency: prn  Interventions Interventions: Breast feeding basics reviewed;Assisted with latch;Skin to skin;Breast massage;Hand express;Breast compression;Adjust position;DEBP;Ice;Hand pump;Coconut oil;Position options;Support pillows;Education  Discharge Discharge Education: Engorgement and breast care Pump: DEBP;Manual;Personal WIC Program: No  Consult Status Consult Status: Follow-up Date: 03/22/21 Follow-up type: In-patient    Marjean Imperato R Graceson Nichelson 03/21/2021, 4:44 AM

## 2021-03-21 NOTE — Progress Notes (Signed)
SVD: repeat with TL  S:  Pt reports feeling well. Desires early d/c/ Tolerating po/ Voiding without problems/ No n/v/ Bleeding is light/ Pain controlled withnarcotic analgesics including oxycodone/acetaminophen (Percocet, Tylox)    O:  A & O x 3 / VS: Blood pressure 112/77, pulse 98, temperature 98.2 F (36.8 C), temperature source Oral, resp. rate 18, height 5' (1.524 m), weight 108.6 kg, last menstrual period 06/19/2020, SpO2 100 %, unknown if currently breastfeeding.  LABS: No results found for this or any previous visit (from the past 24 hour(s)).  I&O: I/O last 3 completed shifts: In: 1000 [P.O.:400; I.V.:600] Out: 1100 [Urine:1100]   No intake/output data recorded.  Lungs: chest clear, no wheezing, rales, normal symmetric air entry  Heart: regular rate and rhythm, S1, S2 normal, no murmur, click, rub or gallop  Abdomen: uterus firm 1 FB above umb  Perineum: not inspected  Lochia: light  Extremities:no redness or tenderness in the calves or thighs, edema 2+  Incision: primary dressing intact. Dried brown blood stain on right  A/P: POD # 2/PPD # 2/ [id number]  Doing well  Continue routine post partum orders  D/c instructions reviewed D/ home Scripts sent

## 2021-03-21 NOTE — Discharge Summary (Signed)
Postpartum Discharge Summary  Date of Service update     Patient Name: Ellen Rowe DOB: 07-Jan-1981 MRN: [id number]  Date of admission: 03/19/2021 Delivery date:03/19/2021  Delivering provider: Danish Ruffins  Date of discharge: 03/21/2021  Admitting diagnosis: Previous cesarean section complicating pregnancy [O84.166] Postpartum care following cesarean delivery [Z39.2] Intrauterine pregnancy: [redacted]w[redacted]d    Secondary diagnosis:  Active Problems:   Previous cesarean section complicating pregnancy Class A2 GDM Desires sterilization  Additional problems: obesity, AMA    Discharge diagnosis: Term Pregnancy Delivered, GDM A2, and desires sterilization                                               Post partum procedures: TL done at C/S Augmentation: N/A Complications: None  Hospital course: Sceduled C/S   40y.o. yo [id number] admitted to the hospital 03/19/2021 for scheduled cesarean section with the following indication:Elective Repeat and TL .Delivery details are as follows:  Membrane Rupture Time/Date: 8:08 AM ,03/19/2021   Delivery Method:C-Section, Vacuum Assisted  Details of operation can be found in separate operative note.  Patient had an uncomplicated postpartum course.  She is ambulating, tolerating a regular diet, passing flatus, and urinating well. Patient is discharged home in stable condition on  03/21/2021        Newborn Data: Birth date:03/19/2021  Birth time:8:10 AM  Gender:Female  Living status:Living  Apgars:8 ,9  Weight:3.72 kg     Magnesium Sulfate received: No BMZ received: No Rhophylac:N/A MMR:N/A T-DaP:Given prenatally Flu: No Transfusion:No  Physical exam  Vitals:   03/20/21 0430 03/20/21 1255 03/20/21 2222 03/21/21 0551  BP: 115/70 132/79 128/70 112/77  Pulse: 100 99 (!) 104 98  Resp: _0 Temp: 98.3 F (36.8 C) 98.3 F (36.8 C) 98 F (36.7 C) 98.2 F (36.8 C)  TempSrc: Axillary Oral  Oral  SpO2: 97% 97%  100%   Weight:      Height:       General: alert, cooperative, and no distress Lochia: appropriate Uterine Fundus: firm Incision: Dressing is clean, dry, and intact DVT Evaluation: No evidence of DVT seen on physical exam. Calf/Ankle edema is present Labs: Lab Results  Component Value Date   WBC 9.5 03/20/2021   HGB 9.5 (L) 03/20/2021   HCT 27.5 (L) 03/20/2021   MCV 96.5 03/20/2021   PLT 213 03/20/2021   CMP Latest Ref Rng & Units 03/16/2021  Glucose 70 - 99 mg/dL 78  BUN 6 - 20 mg/dL <5(L)  Creatinine 0.44 - 1.00 mg/dL 0.48  Sodium 135 - 145 mmol/L 137  Potassium 3.5 - 5.1 mmol/L 3.9  Chloride 98 - 111 mmol/L 104  CO2 22 - 32 mmol/L 23  Calcium 8.9 - 10.3 mg/dL 9.4  Total Protein 6.5 - 8.1 g/dL 6.6  Total Bilirubin 0.3 - 1.2 mg/dL 1.2  Alkaline Phos 38 - 126 U/L 78  AST 15 - 41 U/L 16  ALT 0 - 44 U/L 8   Edinburgh Score: Edinburgh Postnatal Depression Scale Screening Tool 03/19/2021  I have been able to laugh and see the funny side of things. 0  I have looked forward with enjoyment to things. 0  I have blamed myself unnecessarily when things went wrong. 0  I have been anxious or worried for no good reason. 0  I have felt scared or  panicky for no good reason. 0  Things have been getting on top of me. 0  I have been so unhappy that I have had difficulty sleeping. 0  I have felt sad or miserable. 0  I have been so unhappy that I have been crying. 0  The thought of harming myself has occurred to me. 0  Edinburgh Postnatal Depression Scale Total 0      After visit meds:  Allergies as of 03/21/2021   No Known Allergies      Medication List     TAKE these medications    ferrous sulfate 325 (65 FE) MG tablet Take 325 mg by mouth 2 (two) times daily with a meal.   hydrochlorothiazide 12.5 MG capsule Commonly known as: MICROZIDE Take 1 capsule (12.5 mg total) by mouth daily for 4 days.   ibuprofen 600 MG tablet Commonly known as: ADVIL Take 1 tablet (600 mg total)  by mouth every 6 (six) hours as needed.   multivitamin-prenatal 27-0.8 MG Tabs tablet Take 2 tablets by mouth daily.   oxyCODONE 5 MG immediate release tablet Commonly known as: Oxy IR/ROXICODONE Take 1-2 tablets (5-10 mg total) by mouth every 4 (four) hours as needed for moderate pain.   Vitamin C 250 MG Chew Chew 250 mg by mouth in the morning and at bedtime.         Discharge home in stable condition Infant Feeding: Breast Infant Disposition:home with mother Discharge instruction: per After Visit Summary and Postpartum booklet. Activity: Advance as tolerated. Pelvic rest for 6 weeks.  Diet: routine diet Anticipated Birth Control:  TL done at C/S Postpartum Appointment:6 weeks Additional Postpartum F/U: 2 hour GTT Future Appointments:No future appointments. Follow up Visit:  Follow-up Information     Servando Salina, MD Follow up in 6 week(s).   Specialty: Obstetrics and Gynecology Contact information: [address] [place] [address] [phone]                     03/21/2021 Marvene Staff, MD

## 2021-03-23 LAB — SURGICAL PATHOLOGY

## 2021-03-26 ENCOUNTER — Inpatient Hospital Stay (HOSPITAL_COMMUNITY): Admit: 2021-03-26 | Payer: Self-pay

## 2021-03-31 ENCOUNTER — Telehealth (HOSPITAL_COMMUNITY): Payer: Self-pay | Admitting: *Deleted

## 2021-03-31 NOTE — Telephone Encounter (Signed)
Doing well.  Still has honeycomb dressing on.  Advised pt to remove dressing and reviewed incision care.  Pt expressed concern that she is not sure when MD wanted her to remove dressing.  Encouraged her to call her MD's office tomorrow and ask.  No other concerns.  Baby is well.  Follow-up with pediatrician on July 7 for weight check, but thinks feedings are going well.  Salena Saner, RN 03-31-21  19:58

## 2021-07-27 ENCOUNTER — Ambulatory Visit (HOSPITAL_BASED_OUTPATIENT_CLINIC_OR_DEPARTMENT_OTHER): Payer: BC Managed Care – PPO | Admitting: Cardiovascular Disease

## 2021-09-23 ENCOUNTER — Encounter (HOSPITAL_BASED_OUTPATIENT_CLINIC_OR_DEPARTMENT_OTHER): Payer: Self-pay

## 2021-10-05 NOTE — Progress Notes (Signed)
Advanced Hypertension Clinic Initial Assessment:    Date:  10/06/2021   ID:  Ellen Rowe, DOB 02/04/1981, MRN [id number]  PCP:  Kerin Salen, PA-C  Cardiologist:  None  Nephrologist:  Referring MD: Maxie Better, MD   CC: Hypertension  History of Present Illness:    Ellen Rowe is a 41 y.o. female with a hx of pregnancy-induced hypertension, diabetes, anemia, and sickle cell trait here to establish care in the Advanced Hypertension Clinic.  She was seen in the ED 03/16/21 for gestational hypertension. She was [redacted] weeks pregnant and her blood pressure was 143/90. She was seen in the ED 03/19/21 for C-section at 39 weeks. Her post-partum blood pressure was normal.  She saw Dr. Cherly Hensen 04/2021 and complained of ongoing LE edema. Her blood pressure at that time was 140/82 on no medications so she was referred to advanced hypertension clinic.   Today, she reports her blood pressure has been high since a year before she became pregnant. Initially, she was borderline-hypertensive and her blood pressure increased during pregnancy. After pregnancy, her blood pressure went back down to borderline. She is no longer checking her blood pressure at home but she owns a wrist cuff machine. After delivery, she has not needed ibuprofen or aleve. Dr. Cherly Hensen gave her hydralazine for the LE swelling before and during pregnancy. Occasionally, she notices brief palpitation episodes that do not bother her. While pregnant, she had headaches which improved after delivery. She reports her father and maternal grandmother both had CHF. Her father had congestive heart failure in his 59s, an MI, and 3x CABG. Her mother also has hypertension. She opts to cook at home and only adds a little bit of salt. She has cut back on caffeine and does not take supplements. However, she enjoys eating pork. She does not exercise regularly. However, she is part of the [facility] in [place]. Her husband has mentioned  she snores. She feels she could sleep and feel well-rested if she did not have a newborn. Of note, she is breastfeeding. She denies any chest pain, or shortness of breath, lightheadedness, syncope, orthopnea, PND, or exertional symptoms.   Past Medical History:  Diagnosis Date   Anemia    Family history of premature CAD 10/06/2021   Gestational diabetes    Pregnancy induced hypertension    Scoliosis    Sickle cell trait (HCC)    Tachycardia 10/06/2021    Past Surgical History:  Procedure Laterality Date   CESAREAN SECTION N/A 02/07/2015   Procedure: CESAREAN SECTION;  Surgeon: Maxie Better, MD;  Location: [facility];  Service: Obstetrics;  Laterality: N/A;   CESAREAN SECTION WITH BILATERAL TUBAL LIGATION N/A 03/19/2021   Procedure: CESAREAN SECTION WITH BILATERAL TUBAL LIGATION;  Surgeon: Maxie Better, MD;  Location: [facility] ORS;  Service: Obstetrics;  Laterality: N/A;   WISDOM TOOTH EXTRACTION  2000    Current Medications: Current Meds  Medication Sig   Prenatal Vit-Fe Fumarate-FA (MULTIVITAMIN-PRENATAL) 27-0.8 MG TABS tablet Take 2 tablets by mouth daily.     Allergies:   Patient has no known allergies.   Social History   Socioeconomic History   Marital status: Married    Spouse name: Not on file   Number of children: Not on file   Years of education: Not on file   Highest education level: Not on file  Occupational History   Not on file  Tobacco Use   Smoking status: Never   Smokeless tobacco: Never  Vaping Use  Vaping Use: Never used  Substance and Sexual Activity   Alcohol use: No   Drug use: No   Sexual activity: Never  Other Topics Concern   Not on file  Social History Narrative   Not on file   Social Determinants of Health   Financial Resource Strain: Low Risk    Difficulty of Paying Living Expenses: Not hard at all  Food Insecurity: No Food Insecurity   Worried About Programme researcher, broadcasting/film/videounning Out of Food in the Last Year: Never true   Ran Out of Food in the Last  Year: Never true  Transportation Needs: No Transportation Needs   Lack of Transportation (Medical): No   Lack of Transportation (Non-Medical): No  Physical Activity: Inactive   Days of Exercise per Week: 0 days   Minutes of Exercise per Session: 0 min  Stress: Not on file  Social Connections: Not on file     Family History: The patient's family history includes Diabetes in her father and mother; Heart attack in her father; Heart disease in her father; Heart failure in her father and maternal grandmother; Hypertension in her father and mother; Stroke in her father.  ROS:   Please see the history of present illness.    (+) Palpitations (+) Headaches All other systems reviewed and are negative.  EKGs/Labs/Other Studies Reviewed:    No prior cardiovascular studies  EKG:   10/06/21: Sinus tachycardia, rate 102 bpm; non-specific T-wave abnormalities  Recent Labs: 03/16/2021: ALT 8; BUN <5; Creatinine, Ser 0.48; Potassium 3.9; Sodium 137 03/20/2021: Hemoglobin 9.5; Platelets 213   Recent Lipid Panel No results found for: CHOL, TRIG, HDL, CHOLHDL, VLDL, LDLCALC, LDLDIRECT  Physical Exam:   VS:  BP 132/84 (BP Location: Left Arm, Patient Position: Sitting, Cuff Size: Large)    Pulse (!) 102    Ht 5' (1.524 m)    Wt 219 lb 9.6 oz (99.6 kg)    BMI 42.89 kg/m  , BMI Body mass index is 42.89 kg/m. GENERAL:  Well appearing HEENT: Pupils equal round and reactive, fundi not visualized, oral mucosa unremarkable NECK:  No jugular venous distention, waveform within normal limits, carotid upstroke brisk and symmetric, no bruits, no thyromegaly LYMPHATICS:  No cervical adenopathy LUNGS:  Clear to auscultation bilaterally HEART:  Tachycardia, RR.  PMI not displaced or sustained,S1 and S2 within normal limits, no S3, no S4, no clicks, no rubs, no murmurs ABD:  Flat, positive bowel sounds normal in frequency in pitch, no bruits, no rebound, no guarding, no midline pulsatile mass, no hepatomegaly, no  splenomegaly EXT:  2 plus pulses throughout, no edema, no cyanosis no clubbing SKIN:  No rashes no nodules NEURO:  Cranial nerves II through XII grossly intact, motor grossly intact throughout PSYCH:  Cognitively intact, oriented to person place and time   ASSESSMENT/PLAN:    Borderline hypertension BP has been borderline for years and was elevated during pregnancy.  Today she is slightly above her goal of less than 130/80.  We discussed that there are likely lifestyle factors contributing.  She is going to work on limiting her pork intake.  She was congratulated on cooking at home and trying to limit sodium.  She is going to work on increasing her exercise to least 150 minutes weekly.  We [facility].  If her thyroid has not been checked lately we will check this.  She snores but it does not sound she has sleep apnea.  We will defer  on sleep study at this time.  Tachycardia Sinus tachycardia on exam today.  She has occasional palpitations.  Given this, her intermittent lower extremity edema, and family history of heart failure we will get a baseline echo.  If she didn't have a TSH and CBC checked on her recent labs, we will check them.   Family history of premature CAD Father had an MI in his 17s.  We will work on prevention as above.  She had recent lab work,  If there wasn't a lipid panel we will get one.  Referral to PREP and we discussed limiting animal products.     Screening for Secondary Hypertension:  Causes 10/06/2021  Drugs/Herbals Screened     - Comments cooks at home, limits caffeine and salt, limited NSAIDS  Sleep Apnea Screened     - Comments snores, no apnea    Relevant Labs/Studies: Basic Labs Latest Ref Rng & Units 03/16/2021 02/06/2015  Sodium 135 - 145 mmol/L 137 137  Potassium 3.5 - 5.1 mmol/L 3.9 3.9  Creatinine 0.44 - 1.00 mg/dL 4.13 2.44       Disposition:    FU with Otis Portal C. Duke Salvia, MD, [facility] in 3 months  Medication  Adjustments/Labs and Tests Ordered: Current medicines are reviewed at length with the patient today.  Concerns regarding medicines are outlined above.  Orders Placed This Encounter  Procedures   Amb Referral To Provider Referral Exercise Program (P.R.E.P)   EKG 12-Lead   ECHOCARDIOGRAM COMPLETE   No orders of the defined types were placed in this encounter.  I,Mykaella Javier,acting as a scribe for Chilton Si, MD.,have documented all relevant documentation on the behalf of Chilton Si, MD,as directed by  Chilton Si, MD while in the presence of Chilton Si, MD.  I, Amiri Riechers C. Duke Salvia, MD have reviewed all documentation for this visit.  The documentation of the exam, diagnosis, procedures, and orders on 10/06/2021 are all accurate and complete.   Signed, Chilton Si, MD  10/06/2021 2:42 PM    [facility]

## 2021-10-06 ENCOUNTER — Encounter (HOSPITAL_BASED_OUTPATIENT_CLINIC_OR_DEPARTMENT_OTHER): Payer: Self-pay | Admitting: Cardiovascular Disease

## 2021-10-06 ENCOUNTER — Ambulatory Visit (INDEPENDENT_AMBULATORY_CARE_PROVIDER_SITE_OTHER): Payer: BC Managed Care – PPO | Admitting: Cardiovascular Disease

## 2021-10-06 ENCOUNTER — Other Ambulatory Visit: Payer: Self-pay

## 2021-10-06 ENCOUNTER — Telehealth (HOSPITAL_BASED_OUTPATIENT_CLINIC_OR_DEPARTMENT_OTHER): Payer: Self-pay | Admitting: *Deleted

## 2021-10-06 VITALS — BP 132/84 | HR 102 | Ht 60.0 in | Wt 219.6 lb

## 2021-10-06 DIAGNOSIS — R030 Elevated blood-pressure reading, without diagnosis of hypertension: Secondary | ICD-10-CM | POA: Diagnosis not present

## 2021-10-06 DIAGNOSIS — R000 Tachycardia, unspecified: Secondary | ICD-10-CM

## 2021-10-06 DIAGNOSIS — Z8249 Family history of ischemic heart disease and other diseases of the circulatory system: Secondary | ICD-10-CM | POA: Diagnosis not present

## 2021-10-06 DIAGNOSIS — R600 Localized edema: Secondary | ICD-10-CM | POA: Diagnosis not present

## 2021-10-06 HISTORY — DX: Family history of ischemic heart disease and other diseases of the circulatory system: Z82.49

## 2021-10-06 HISTORY — DX: Tachycardia, unspecified: R00.0

## 2021-10-06 NOTE — Telephone Encounter (Signed)
After visit today Dr Duke Salvia decided she wanted to get Echo on patient Called to advise and schedule, no vm

## 2021-10-06 NOTE — Assessment & Plan Note (Signed)
Sinus tachycardia on exam today.  She has occasional palpitations.  Given this, her intermittent lower extremity edema, and family history of heart failure we will get a baseline echo.  If she didn't have a TSH and CBC checked on her recent labs, we will check them.

## 2021-10-06 NOTE — Assessment & Plan Note (Signed)
BP has been borderline for years and was elevated during pregnancy.  Today she is slightly above her goal of less than 130/80.  We discussed that there are likely lifestyle factors contributing.  She is going to work on limiting her pork intake.  She was congratulated on cooking at home and trying to limit sodium.  She is going to work on increasing her exercise to least 150 minutes weekly.  We will refer her to the PR EP program to the [facility].  If her thyroid has not been checked lately we will check this.  She snores but it does not sound she has sleep apnea.  We will defer on sleep study at this time.

## 2021-10-06 NOTE — Assessment & Plan Note (Signed)
Father had an MI in his 12s.  We will work on prevention as above.  She had recent lab work,  If there wasn't a lipid panel we will get one.  Referral to PREP and we discussed limiting animal products.

## 2021-10-06 NOTE — Patient Instructions (Addendum)
Medication Instructions:  Your physician recommends that you continue on your current medications as directed. Please refer to the Current Medication list given to you today.   *If you need a refill on your cardiac medications before your next appointment, please call your pharmacy*  Lab Work: NONE   Testing/Procedures: Your physician has requested that you have an echocardiogram. Echocardiography is a painless test that uses sound waves to create images of your heart. It provides your doctor with information about the size and shape of your heart and how well your hearts chambers and valves are working. This procedure takes approximately one hour. There are no restrictions for this procedure.  Follow-Up: At [facility], you and your health needs are our priority.  As part of our continuing mission to provide you with exceptional heart care, we have created designated Provider Care Teams.  These Care Teams include your primary Cardiologist (physician) and Advanced Practice Providers (APPs -  Physician Assistants and Nurse Practitioners) who all work together to provide you with the care you need, when you need it.  We recommend signing up for the patient portal called "MyChart".  Sign up information is provided on this After Visit Summary.  MyChart is used to connect with patients for Virtual Visits (Telemedicine).  Patients are able to view lab/test results, encounter notes, upcoming appointments, etc.  Non-urgent messages can be sent to your provider as well.   To learn more about what you can do with MyChart, go to [email or web address].    Your next appointment:   3 month(s)  The format for your next appointment:   In Person  Provider:   Skeet Latch, MD   Other Instructions:  PAM OR LORA FROM PREP PROGRAM WILL BE IN [facility]   Exercise recommendations: The American Heart Association recommends 150 minutes of moderate intensity exercise weekly. Try 30 minutes of moderate  intensity exercise 4-5 times per week. This could include walking, jogging, or swimming.  Echocardiogram An echocardiogram is a test that uses sound waves (ultrasound) to produce images of the heart. Images from an echocardiogram can provide important information about: Heart size and shape. The size and thickness and movement of your heart's walls. Heart muscle function and strength. Heart valve function or if you have stenosis. Stenosis is when the heart valves are too narrow. If blood is flowing backward through the heart valves (regurgitation). A tumor or infectious growth around the heart valves. Areas of heart muscle that are not working well because of poor blood flow or injury from a heart attack. Aneurysm detection. An aneurysm is a weak or damaged part of an artery wall. The wall bulges out from the normal force of blood pumping through the body. Tell a health care provider about: Any allergies you have. All medicines you are taking, including vitamins, herbs, eye drops, creams, and over-the-counter medicines. Any blood disorders you have. Any surgeries you have had. Any medical conditions you have. Whether you are pregnant or may be pregnant. What are the risks? Generally, this is a safe test. However, problems may occur, including an allergic reaction to dye (contrast) that may be used during the test. What happens before the test? No specific preparation is needed. You may eat and drink normally. What happens during the test?  You will take off your clothes from the waist up and put on a hospital gown. Electrodes or electrocardiogram (ECG)patches may be placed on your chest. The electrodes or patches are then connected to a device that monitors  your heart rate and rhythm. You will lie down on a table for an ultrasound exam. A gel will be applied to your chest to help sound waves pass through your skin. A handheld device, called a transducer, will be pressed against your  chest and moved over your heart. The transducer produces sound waves that travel to your heart and bounce back (or "echo" back) to the transducer. These sound waves will be captured in real-time and changed into images of your heart that can be viewed on a video monitor. The images will be recorded on a computer and reviewed by your health care provider. You may be asked to change positions or hold your breath for a short time. This makes it easier to get different views or better views of your heart. In some cases, you may receive contrast through an IV in one of your veins. This can improve the quality of the pictures from your heart. The procedure may vary among health care providers and hospitals. What can I expect after the test? You may return to your normal, everyday life, including diet, activities, and medicines, unless your health care provider tells you not to do that. Follow these instructions at home: It is up to you to get the results of your test. Ask your health care provider, or the department that is doing the test, when your results will be ready. Keep all follow-up visits. This is important. Summary An echocardiogram is a test that uses sound waves (ultrasound) to produce images of the heart. Images from an echocardiogram can provide important information about the size and shape of your heart, heart muscle function, heart valve function, and other possible heart problems. You do not need to do anything to prepare before this test. You may eat and drink normally. After the echocardiogram is completed, you may return to your normal, everyday life, unless your health care provider tells you not to do that. This information is not intended to replace advice given to you by your health care provider. Make sure you discuss any questions you have with your health care provider. Document Revised: 05/27/2021 Document Reviewed: 05/06/2020 Elsevier Patient Education  2022 [employer].

## 2021-10-09 ENCOUNTER — Telehealth: Payer: Self-pay

## 2021-10-09 NOTE — Telephone Encounter (Signed)
Pt returned missed call. Explained I was calling reference PREP referral Requests info be sent over via mail-will send program flyer Will need an evening class due to work schedule-and needs childcare for class time  Advised will verify availability with [facility] and call her back.

## 2021-10-09 NOTE — Telephone Encounter (Signed)
Attempted to call pt reference PREP referral  Unable to leave a message due to mailbox full Will re-attempt at later date

## 2021-10-09 NOTE — Telephone Encounter (Signed)
Scheduled patient for Echo next week  She is aware of date, time and location

## 2021-10-15 ENCOUNTER — Other Ambulatory Visit (HOSPITAL_BASED_OUTPATIENT_CLINIC_OR_DEPARTMENT_OTHER): Payer: BC Managed Care – PPO

## 2021-10-23 ENCOUNTER — Other Ambulatory Visit: Payer: Self-pay

## 2021-10-23 ENCOUNTER — Ambulatory Visit (INDEPENDENT_AMBULATORY_CARE_PROVIDER_SITE_OTHER): Payer: BC Managed Care – PPO

## 2021-10-23 DIAGNOSIS — R030 Elevated blood-pressure reading, without diagnosis of hypertension: Secondary | ICD-10-CM

## 2021-10-23 DIAGNOSIS — R600 Localized edema: Secondary | ICD-10-CM

## 2021-10-23 DIAGNOSIS — R000 Tachycardia, unspecified: Secondary | ICD-10-CM | POA: Diagnosis not present

## 2021-10-23 DIAGNOSIS — Z8249 Family history of ischemic heart disease and other diseases of the circulatory system: Secondary | ICD-10-CM

## 2021-10-23 LAB — ECHOCARDIOGRAM COMPLETE
AR max vel: 2.18 cm2
AV Area VTI: 2.24 cm2
AV Area mean vel: 2.26 cm2
AV Mean grad: 3 mmHg
AV Peak grad: 5.9 mmHg
Ao pk vel: 1.21 m/s
Area-P 1/2: 3.3 cm2
Calc EF: 51.8 %
S' Lateral: 3.08 cm
Single Plane A2C EF: 48.8 %
Single Plane A4C EF: 53.3 %

## 2022-01-12 ENCOUNTER — Ambulatory Visit (HOSPITAL_BASED_OUTPATIENT_CLINIC_OR_DEPARTMENT_OTHER): Payer: BC Managed Care – PPO | Admitting: Cardiovascular Disease

## 2023-04-26 IMAGING — US US MFM OB FOLLOW-UP
1 series · 13 of 28 positions shown · non-contrast
Comparison: none

[Series 1: us mfm ob follow-up · 42 acquisitions, 13 frames shown]
[im 2/42]
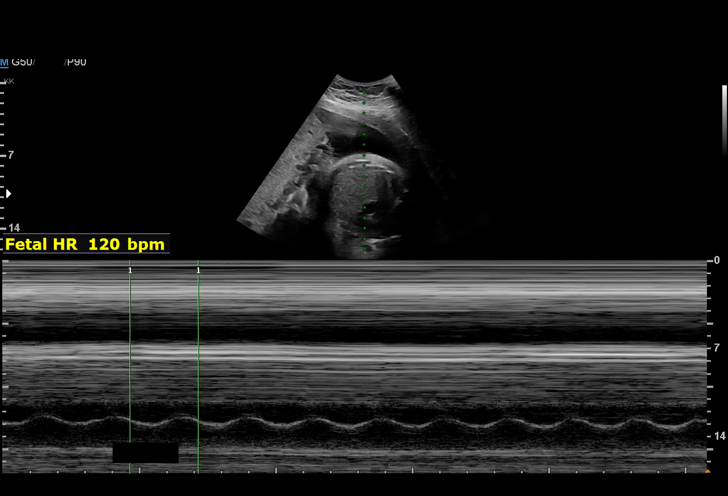
[im 5/42]
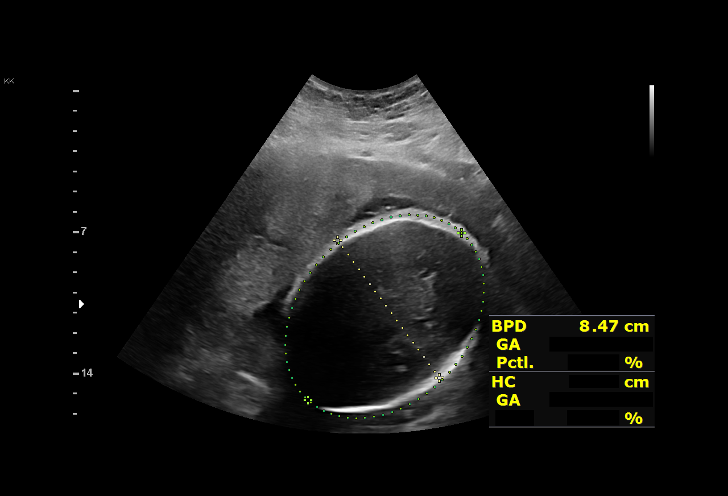
[im 8/42]
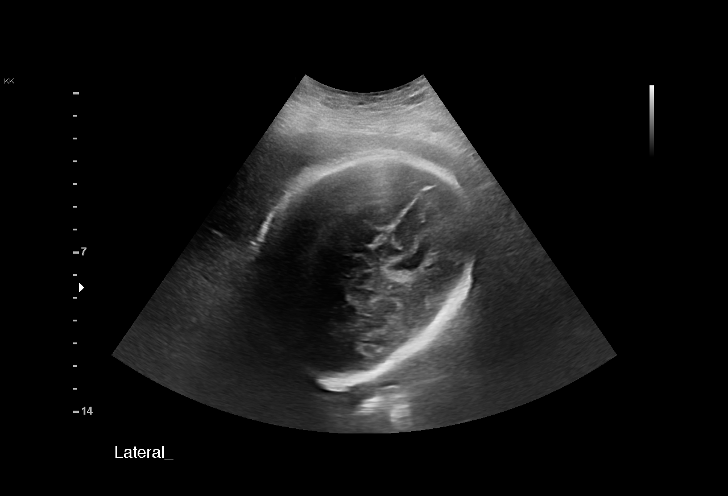
[im 11/42]
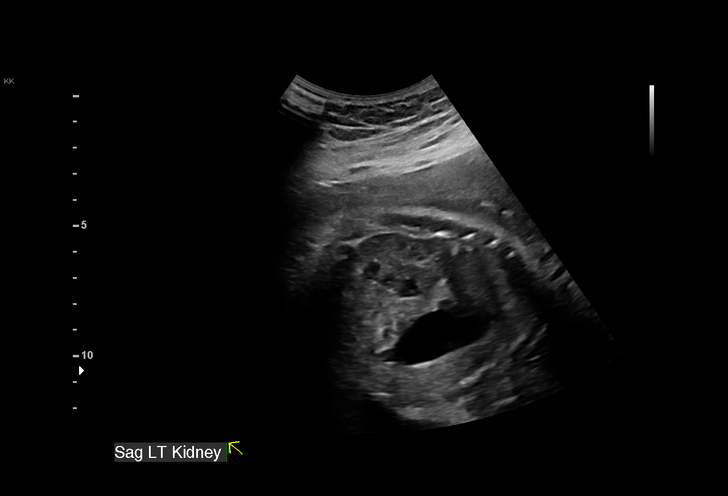
[im 14/42]
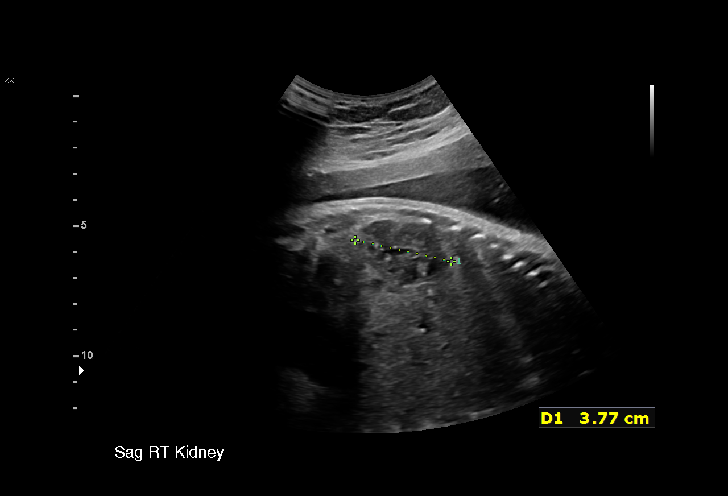
[im 17/42]
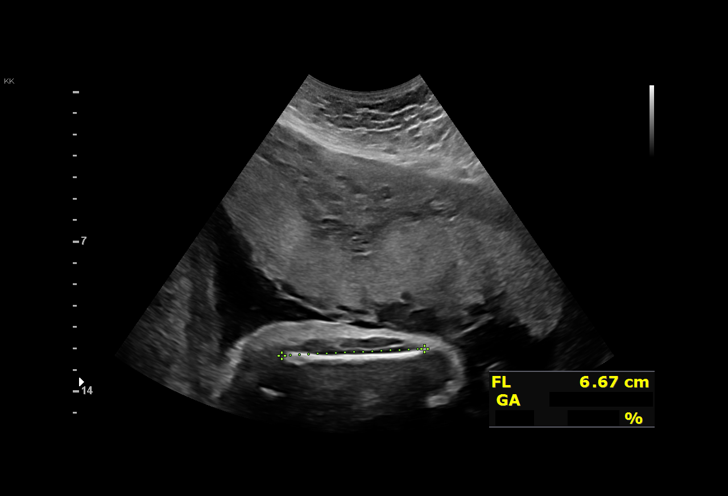
[im 22/42]
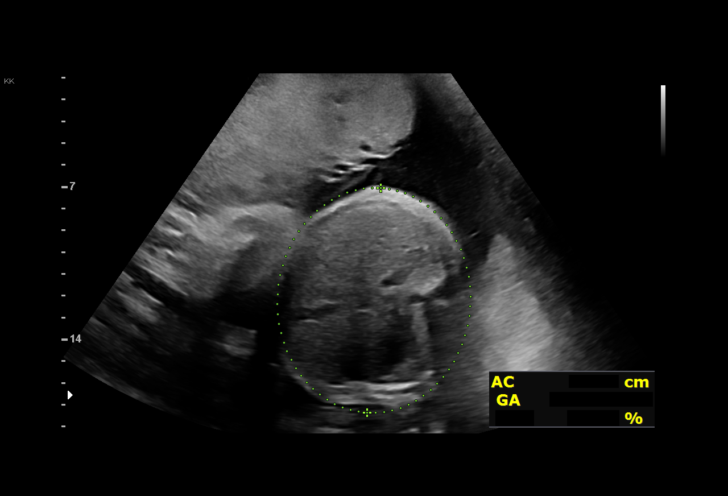
[im 25/42]
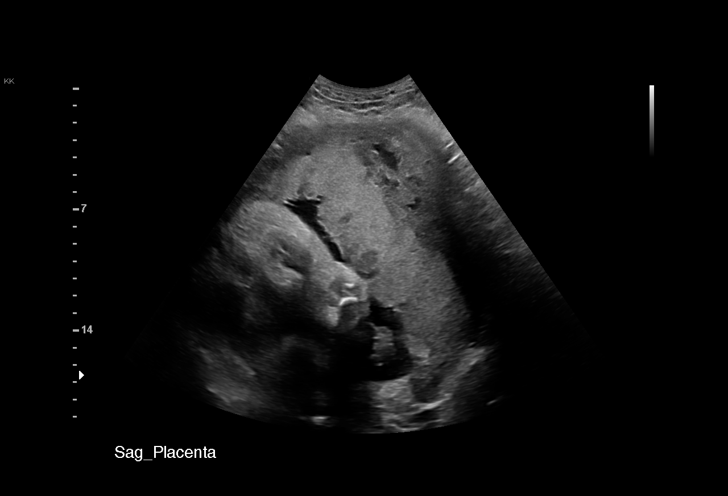
[im 28/42]
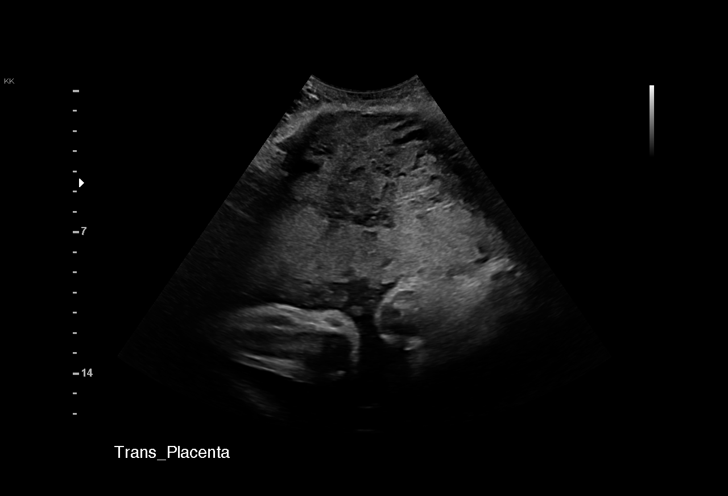
[im 31/42]
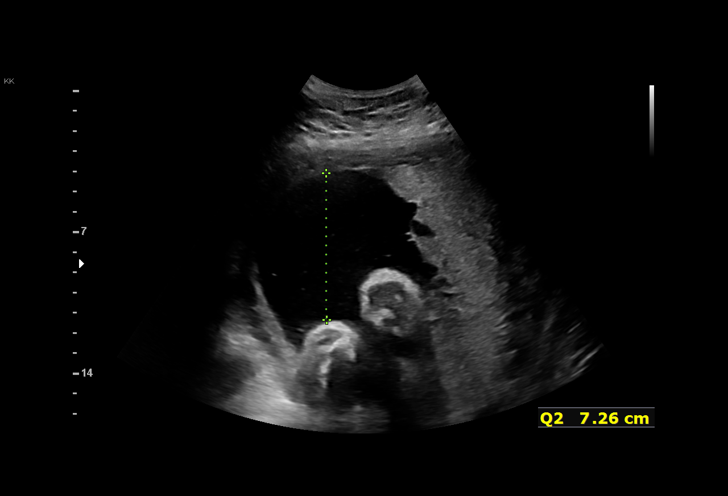
[im 34/42]
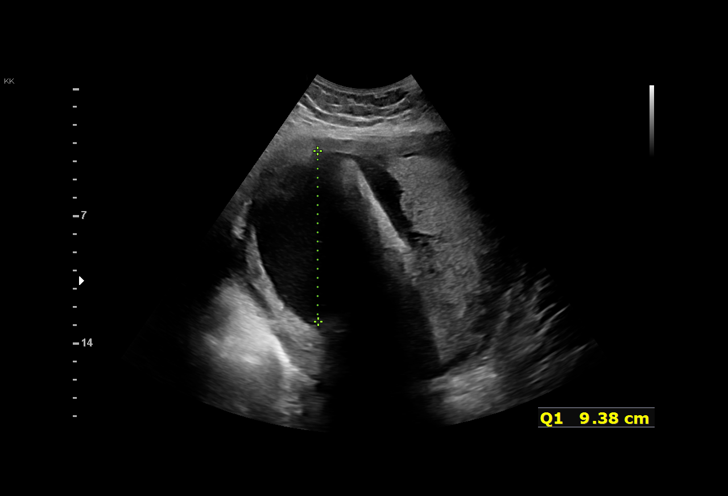
[im 37/42]
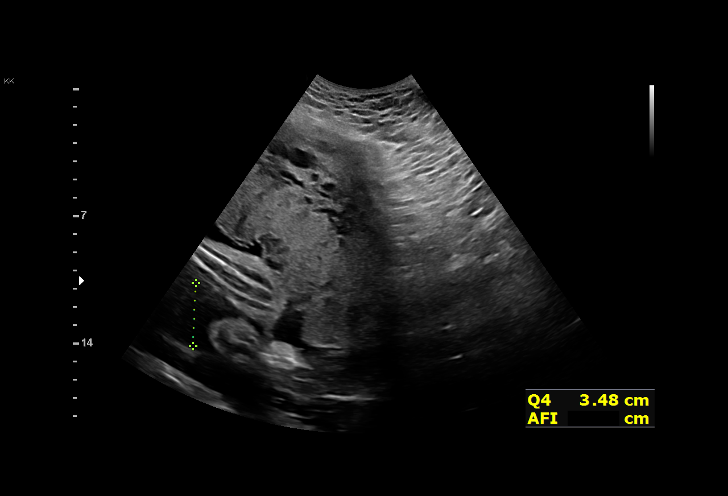
[im 40/42]
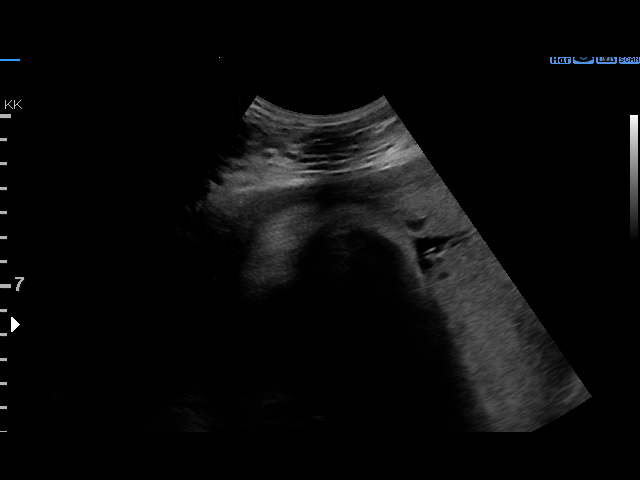

[13 of 28 positions shown; findings below may reference images not displayed]

OB/GYN

                                                      [facility]

Indications

 Gestational diabetes in pregnancy, diet
 controlled
 Uterine size-date discrepancy, third trimester
 33 weeks gestation of pregnancy
 History of cesarean delivery, currently
 pregnant
 Obesity complicating pregnancy, third
 trimester (Pregravid BMI 41)
 Advanced maternal age multigravida 35+,
 third trimester ( 39 yrs)
 Low Risk NIPS(Normal ECHO)
Fetal Evaluation

 Num Of Fetuses:         1
 Fetal Heart Rate(bpm):  120
 Cardiac Activity:       Observed
 Presentation:           Cephalic
 Placenta:               Anterior
 P. Cord Insertion:      Previously Visualized

 Amniotic Fluid
 AFI FV:      Within normal limits

 AFI Sum(cm)     %Tile       Largest Pocket(cm)
 22.2            85
 RUQ(cm)       RLQ(cm)       LUQ(cm)        LLQ(cm)

Biometry

 BPD:      82.8  mm     G. Age:  33w 2d         53  %    CI:         71.7   %    70 - 86
                                                         FL/HC:      21.5   %    19.9 -
 HC:      311.3  mm     G. Age:  34w 6d         61  %    HC/AC:      1.04        0.96 -
 AC:      299.5  mm     G. Age:  33w 6d         77  %    FL/BPD:     80.7   %    71 - 87
 FL:       66.8  mm     G. Age:  34w 3d         75  %    FL/AC:      22.3   %    20 - 24

 Est. FW:    6866  gm      5 lb 3 oz     75  %
Gestational Age

 LMP:           33w 0d        Date:  06/19/20                 EDD:   03/26/21
 U/S Today:     34w 1d                                        EDD:   03/18/21
 Best:          33w 0d     Det. By:  LMP  (06/19/20)          EDD:   03/26/21
Anatomy

 Cranium:               Appears normal         LVOT:                   Previously seen
 Cavum:                 Previously seen        Aortic Arch:            Previously seen
 Ventricles:            Previously seen        Ductal Arch:            Previously seen
 Choroid Plexus:        Previously seen        Diaphragm:              Previously seen
 Cerebellum:            Previously seen        Stomach:                Appears normal, left
                                                                       sided
 Posterior Fossa:       Previously seen        Abdomen:                Appears normal
 Nuchal Fold:           Not applicable (>20    Abdominal Wall:         Previously seen
                        wks GA)
 Face:                  Orbits and profile     Cord Vessels:           Previously seen
                        previously seen
 Lips:                  Previously seen        Kidneys:                Appear normal
 Palate:                Previously seen        Bladder:                Appears normal
 Thoracic:              Appears normal         Spine:                  Not well visualized
 Heart:                 Previously seen        Upper Extremities:      Previously seen
 RVOT:                  Previously seen        Lower Extremities:      Previously seen

 Other:  SVC IVC, 3VV/T, Heels, and 5th digit previously visualized. Fetus
         appears to be female.
Cervix Uterus Adnexa

 Cervix
 Not visualized (advanced GA >19wks)
Impression

 G4 P1. Uterine-size-date discrepancy. Patient has gestational
 diabetes that is well controlled on diet.  She had normal fetal
 echocardiography.
 Obstetric history significant for a term cesarean delivery.
 Advanced maternal age (>40 years).
 Today's ultrasound, fetal growth is appropriate for gestational
 age.  Amniotic fluid is normal and good fetal activity seen.
 Cephalic presentation.
 We reassured the patient of the findings.
Recommendations

 -BPP in 3 weeks and then weekly BPP till delivery.
 -Fetal growth in 4 weeks.
                 Melgarejo, Regiane
